# Patient Record
Sex: Female | Born: 1960 | Race: White | Hispanic: No | Marital: Married | Smoking: Former smoker
Health system: Southern US, Community
[De-identification: ages and names within clinical notes are randomized; demographics above are authoritative.]

## PROBLEM LIST (undated history)

## (undated) DIAGNOSIS — N926 Irregular menstruation, unspecified: Secondary | ICD-10-CM

## (undated) DIAGNOSIS — K219 Gastro-esophageal reflux disease without esophagitis: Secondary | ICD-10-CM

## (undated) DIAGNOSIS — J9383 Other pneumothorax: Secondary | ICD-10-CM

## (undated) DIAGNOSIS — L719 Rosacea, unspecified: Secondary | ICD-10-CM

## (undated) DIAGNOSIS — N840 Polyp of corpus uteri: Secondary | ICD-10-CM

## (undated) DIAGNOSIS — K644 Residual hemorrhoidal skin tags: Secondary | ICD-10-CM

## (undated) DIAGNOSIS — R42 Dizziness and giddiness: Secondary | ICD-10-CM

## (undated) DIAGNOSIS — F419 Anxiety disorder, unspecified: Secondary | ICD-10-CM

## (undated) DIAGNOSIS — I493 Ventricular premature depolarization: Secondary | ICD-10-CM

## (undated) HISTORY — DX: Rosacea, unspecified: L71.9

## (undated) HISTORY — DX: Gastro-esophageal reflux disease without esophagitis: K21.9

## (undated) HISTORY — PX: HYSTEROSCOPY WITH RESECTOSCOPE: SHX5395

## (undated) HISTORY — DX: Anxiety disorder, unspecified: F41.9

## (undated) HISTORY — DX: Dizziness and giddiness: R42

## (undated) HISTORY — DX: Polyp of corpus uteri: N84.0

## (undated) HISTORY — DX: Other pneumothorax: J93.83

## (undated) HISTORY — PX: OTHER SURGICAL HISTORY: SHX169

## (undated) HISTORY — DX: Residual hemorrhoidal skin tags: K64.4

## (undated) HISTORY — DX: Irregular menstruation, unspecified: N92.6

## (undated) HISTORY — DX: Ventricular premature depolarization: I49.3

---

## 1978-07-21 HISTORY — PX: APPENDECTOMY: SHX54

## 1990-07-21 HISTORY — PX: THORACOTOMY: SUR1349

## 1990-10-20 DIAGNOSIS — J9383 Other pneumothorax: Secondary | ICD-10-CM

## 1990-10-20 HISTORY — DX: Other pneumothorax: J93.83

## 1996-07-21 HISTORY — PX: EXCISION MORTON'S NEUROMA: SHX5013

## 2001-05-26 ENCOUNTER — Other Ambulatory Visit: Admission: RE | Admit: 2001-05-26 | Discharge: 2001-05-26 | Payer: Self-pay | Admitting: *Deleted

## 2003-09-22 ENCOUNTER — Encounter: Admission: RE | Admit: 2003-09-22 | Discharge: 2003-09-22 | Payer: Self-pay | Admitting: Family Medicine

## 2004-05-02 ENCOUNTER — Other Ambulatory Visit: Admission: RE | Admit: 2004-05-02 | Discharge: 2004-05-02 | Payer: Self-pay | Admitting: Family Medicine

## 2005-09-02 ENCOUNTER — Other Ambulatory Visit: Admission: RE | Admit: 2005-09-02 | Discharge: 2005-09-02 | Payer: Self-pay | Admitting: Family Medicine

## 2006-07-29 ENCOUNTER — Encounter: Admission: RE | Admit: 2006-07-29 | Discharge: 2006-07-29 | Payer: Self-pay | Admitting: Family Medicine

## 2007-04-12 ENCOUNTER — Ambulatory Visit: Payer: Self-pay | Admitting: Surgery

## 2007-07-19 ENCOUNTER — Other Ambulatory Visit: Admission: RE | Admit: 2007-07-19 | Discharge: 2007-07-19 | Payer: Self-pay | Admitting: Family Medicine

## 2007-08-11 ENCOUNTER — Encounter: Admission: RE | Admit: 2007-08-11 | Discharge: 2007-08-11 | Payer: Self-pay | Admitting: Family Medicine

## 2008-07-31 ENCOUNTER — Other Ambulatory Visit: Admission: RE | Admit: 2008-07-31 | Discharge: 2008-07-31 | Payer: Self-pay | Admitting: Family Medicine

## 2008-08-31 ENCOUNTER — Encounter: Admission: RE | Admit: 2008-08-31 | Discharge: 2008-08-31 | Payer: Self-pay | Admitting: Family Medicine

## 2008-10-12 ENCOUNTER — Encounter (INDEPENDENT_AMBULATORY_CARE_PROVIDER_SITE_OTHER): Payer: Self-pay | Admitting: Obstetrics and Gynecology

## 2008-10-12 ENCOUNTER — Ambulatory Visit (HOSPITAL_COMMUNITY): Admission: RE | Admit: 2008-10-12 | Discharge: 2008-10-12 | Payer: Self-pay | Admitting: Obstetrics and Gynecology

## 2009-08-07 ENCOUNTER — Other Ambulatory Visit: Admission: RE | Admit: 2009-08-07 | Discharge: 2009-08-07 | Payer: Self-pay | Admitting: Family Medicine

## 2009-09-04 ENCOUNTER — Encounter: Admission: RE | Admit: 2009-09-04 | Discharge: 2009-09-04 | Payer: Self-pay | Admitting: Family Medicine

## 2009-11-08 ENCOUNTER — Encounter: Admission: RE | Admit: 2009-11-08 | Discharge: 2009-11-08 | Payer: Self-pay | Admitting: Family Medicine

## 2010-08-07 ENCOUNTER — Other Ambulatory Visit
Admission: RE | Admit: 2010-08-07 | Discharge: 2010-08-07 | Payer: Self-pay | Source: Home / Self Care | Admitting: Family Medicine

## 2010-08-07 ENCOUNTER — Other Ambulatory Visit: Payer: Self-pay | Admitting: Family Medicine

## 2010-10-29 ENCOUNTER — Other Ambulatory Visit: Payer: Self-pay | Admitting: Family Medicine

## 2010-10-29 DIAGNOSIS — Z1231 Encounter for screening mammogram for malignant neoplasm of breast: Secondary | ICD-10-CM

## 2010-10-31 LAB — COMPREHENSIVE METABOLIC PANEL
ALT: 19 U/L (ref 0–35)
AST: 56 U/L — ABNORMAL HIGH (ref 0–37)
CO2: 26 mEq/L (ref 19–32)
Chloride: 100 mEq/L (ref 96–112)
Creatinine, Ser: 0.82 mg/dL (ref 0.4–1.2)
GFR calc Af Amer: >60 mL/min (ref >60)
GFR calc non Af Amer: >60 mL/min (ref >60)
Glucose, Bld: 86 mg/dL (ref 70–99)
Sodium: 132 mEq/L — ABNORMAL LOW (ref 135–145)
Total Bilirubin: 0.9 mg/dL (ref 0.3–1.2)

## 2010-10-31 LAB — CBC
Hemoglobin: 12.8 g/dL (ref 12.0–15.0)
MCV: 88.2 fL (ref 78.0–100.0)
RBC: 4.3 MIL/uL (ref 3.87–5.11)
WBC: 6.3 10*3/uL (ref 4.0–10.5)

## 2010-10-31 LAB — PREGNANCY, URINE: Preg Test, Ur: NEGATIVE

## 2010-11-04 ENCOUNTER — Ambulatory Visit
Admission: RE | Admit: 2010-11-04 | Discharge: 2010-11-04 | Disposition: A | Discharge status: Home / Self Care | Payer: BLUE CROSS/BLUE SHIELD | Source: Ambulatory Visit | Attending: Family Medicine | Admitting: Family Medicine

## 2010-11-04 DIAGNOSIS — Z1231 Encounter for screening mammogram for malignant neoplasm of breast: Secondary | ICD-10-CM

## 2010-12-03 NOTE — Procedures (Signed)
DUPLEX ULTRASOUND OF ABDOMINAL AORTA   INDICATION:  Rule out abdominal aortic aneurysm.   HISTORY:  Diabetes:  No.  Cardiac:  No.  Hypertension:  No.  Smoking:  Quit in 1992.  Connective Tissue Disorder:  Family History:  The patient's father had a cerebral aneurysm.  Several  of the patient's uncles and cousins have a history of aneurysms as well.  Previous Surgery:  The patient has staples for a spontaneous  pneumothorax.  The patient has a history of C-section delivery.   DUPLEX EXAM:         AP (cm)                   TRANSVERSE (cm)  Proximal             1.4 cm                    1.8 cm  Mid                  1.4 cm                    1.6 cm  Distal               1.2 cm                    1.3 cm  Right Iliac          0.6 cm                    0.7 cm  Left Iliac           0.8 cm                    0.7 cm   PREVIOUS:  Date:  AP:  TRANSVERSE:   IMPRESSION:  No evidence of significant aortic dilation.   ___________________________________________  Janetta Hora Fields, MD   MC/MEDQ  D:  04/12/2007  T:  04/13/2007  Job:  [id number]

## 2010-12-03 NOTE — H&P (Signed)
[name], [name] NO.:  [id number]   MEDICAL RECORD NO.:  [id number]          PATIENT TYPE:  AMB   LOCATION:  SDC                           [facility]ACILITY:  [facility]   PHYSICIAN:  Mindy [name], MDDATE OF BIRTH:  05-Apr-1961   DATE OF ADMISSION:  DATE OF DISCHARGE:                              HISTORY & PHYSICAL   She is to be admitted on October 12, 2008, originally referred from Dr.  Nicholos [name], Mindy [name] at [facility] for irregular periods.  She is not postmenopausal  to be known to me, but has had endometrial polyps noted on ultrasound  and sonohysterogram performed September 12, 2008.  She is now to be  admitted to undergo hysterotomy, D&C, gave informed consent, accepts the  risks of infection, bleeding, bowel and bladder damage, uterine  perforation, blood product risk including hepatitis and HIV exposure.   PAST MEDICAL HISTORY:  Former smoker status, esophageal reflux, anxiety,  rosacea, history of external hemorrhoids and spontaneous pneumothorax  bilaterally, April 1992 with subsequent sclerosis.   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   MEDICATIONS:  1. Phenergan LA 1 capsule twice a day as needed.  2. Maxair Autohaler 200 mcg per inhalation, 4 puffs as needed every 1      hour.  3. Protonix 40 mg once a day.   ALLERGIES:  VENTOLIN HFA makes her nervous; MINOCIN, lightheadedness;  Theo-Dur, rash; and Z-Pak, hives.   SOCIAL HISTORY:  A glass of wine per week.  No tobacco, ethanol, or drug  use.  She is married in a monogamous relationship with her husband.   FAMILY HISTORY:  Father is stable with cerebral aneurysm, abdominal  aneurysm, and hypertension.  Hypertension is present in her mother and  is with emphysema and asthma and she is on continuous O2.  Paternal  grandmother  with diabetes and breast cancer.  Maternal grandmother with  diabetes, heart attack at age 66.  Sister healthy.   PHYSICAL EXAMINATION:  VITAL SIGNS:  Blood pressure 100/60, heart rate  60, respirations 20, afebrile.  LUNGS:  Clear bilaterally.  Slightly decreased upper portion on the  left.  No rales or rhonchi.  ABDOMEN:  Soft, flat, nontender.  HEART:  Regular rate and rhythm.  PELVIC:  Normal external female genitalia.  Bartholin, urethral, and  Skene glands within normal limits.  Vault without discharge or lesions.  Multiparous cervix.  Tenaculum was used.  Dilators were used and Pipelle  was passed without difficulty on September 06, 2008, sounded to 8 cm,  moderate amount of tissue was aspirated with single pass.  The patient  tolerated the procedure well.  Tenaculum was removed.  No bleeding was  noted.  At that time, there was some spotting from the cervix with  biopsy.   ASSESSMENT:  1. Irregular periods, 626.4.  2. Endometrial polyps, 621.0.   PLAN:  Hysteroscopy, D&C with MAC plus paracervical block.  Preoperative  CBC, BMET,  and urine pregnancy test, n.p.o. after 0800 for scheduled 2  o'clock surgical case, clears up until that time.  Pain medications on a  scheduled basis with sips of water.  We will proceed as outlined.  She  gives informed consent.      Mindy A. Sydnee Cabal, MD  Electronically Signed     CAD/MEDQ  D:  10/11/2008  T:  10/12/2008  Job:  [id number]

## 2010-12-03 NOTE — Op Note (Signed)
NAMEMAHA, FISCHEL NO.:  [id number]   MEDICAL RECORD NO.:  [id number]          PATIENT TYPE:  AMB   LOCATION:  SDC                           [facility]ILITY[facility]  [facility]   PHYSICIAN:  Charles A. Delcambre, MDDATE OF BIRTH:  April 30, 1961   DATE OF PROCEDURE:  10/12/2008  DATE OF DISCHARGE:                               OPERATIVE REPORT   PREOPERATIVE DIAGNOSES:  1. Irregular bleeding  2. Endometrial polyps.   POSTOPERATIVE DIAGNOSES:  1. Irregular bleeding  2. Endometrial polyps.  3. Submucosal leiomyoma.   PROCEDURES:  1. Diagnostic hysteroscopy.  2. Resectoscope used to remove polyps and fibroids.  3. Paracervical block.   SURGEON:  Charles A. Delcambre, MD   ASSISTANT:  None.   COMPLICATIONS:  None.   ESTIMATED BLOOD LOSS:  Less than 10 mL.   COUNTS:  Instrument, sponge, and needle counts were correct x2.   SORBITOL LOSS:  100 mL   SPECIMENS:  Endometrial curettings and endometrial polyps/fibroids to  Pathology.   FINDINGS:  Multiple endometrial fundal polyps and a submucosal fibroid.   ANESTHESIA:  Monitored anesthesia care with IV sedation.   DESCRIPTION OF PROCEDURE:  The patient was taken to the operating room,  placed in supine position.  Anesthesia was given.  She was sedated.  She  was placed in dorsal lithotomy position.  Sterile prep and drape was  undertaken.  Time-out was done.  Weighted speculum was placed.  Tenaculum was placed on the anterior lip of the cervix and sound was to  8 cm dilation, 4 and 5-mm hysteroscope was then done.  Hysteroscope was  passed without difficulty or perforation.  Findings were noted above.  Hysteroscope was removed.  The further dilation was done to accommodate  the resectoscope.  Resectoscope single loop, with settings 90 watts cut,  50 watts coag was used with a single electrode, single loop used to  resect the polyps and the fibroid without difficulty.  There was no  evidence of complication.  Specimens were  retrieved, one last look  indicated in clear uterine cavity with good hemostasis.  Procedure was then terminated.  All instruments were removed.  Hemostasis was verified from the uterus as well as the anterior lip of  the cervix at the tenaculum site.  She is taken to recovery with  physician in attendance, having tolerated the procedure well.      Charles A. Sydnee Cabal, MD  Electronically Signed     CAD/MEDQ  D:  10/12/2008  T:  10/13/2008  Job:  [id number]

## 2010-12-08 ENCOUNTER — Emergency Department (HOSPITAL_COMMUNITY): Payer: BC Managed Care – PPO

## 2010-12-08 ENCOUNTER — Emergency Department (HOSPITAL_COMMUNITY)
Admission: EM | Admit: 2010-12-08 | Discharge: 2010-12-08 | Disposition: A | Discharge status: Home / Self Care | Payer: BC Managed Care – PPO | Attending: Emergency Medicine | Admitting: Emergency Medicine

## 2010-12-08 DIAGNOSIS — M25569 Pain in unspecified knee: Secondary | ICD-10-CM | POA: Insufficient documentation

## 2010-12-08 DIAGNOSIS — W1809XA Striking against other object with subsequent fall, initial encounter: Secondary | ICD-10-CM | POA: Insufficient documentation

## 2010-12-08 DIAGNOSIS — S0180XA Unspecified open wound of other part of head, initial encounter: Secondary | ICD-10-CM | POA: Insufficient documentation

## 2010-12-08 DIAGNOSIS — J45909 Unspecified asthma, uncomplicated: Secondary | ICD-10-CM | POA: Insufficient documentation

## 2010-12-08 DIAGNOSIS — IMO0002 Reserved for concepts with insufficient information to code with codable children: Secondary | ICD-10-CM | POA: Insufficient documentation

## 2010-12-08 DIAGNOSIS — Y92009 Unspecified place in unspecified non-institutional (private) residence as the place of occurrence of the external cause: Secondary | ICD-10-CM | POA: Insufficient documentation

## 2010-12-08 DIAGNOSIS — M79609 Pain in unspecified limb: Secondary | ICD-10-CM | POA: Insufficient documentation

## 2010-12-08 DIAGNOSIS — M25519 Pain in unspecified shoulder: Secondary | ICD-10-CM | POA: Insufficient documentation

## 2010-12-08 DIAGNOSIS — S60229A Contusion of unspecified hand, initial encounter: Secondary | ICD-10-CM | POA: Insufficient documentation

## 2010-12-19 ENCOUNTER — Emergency Department (HOSPITAL_COMMUNITY): Payer: BC Managed Care – PPO

## 2010-12-19 ENCOUNTER — Emergency Department (HOSPITAL_COMMUNITY)
Admission: EM | Admit: 2010-12-19 | Discharge: 2010-12-19 | Disposition: A | Discharge status: Home / Self Care | Payer: BC Managed Care – PPO | Attending: Emergency Medicine | Admitting: Emergency Medicine

## 2010-12-19 DIAGNOSIS — R42 Dizziness and giddiness: Secondary | ICD-10-CM | POA: Insufficient documentation

## 2010-12-19 DIAGNOSIS — R002 Palpitations: Secondary | ICD-10-CM | POA: Insufficient documentation

## 2010-12-19 DIAGNOSIS — M542 Cervicalgia: Secondary | ICD-10-CM | POA: Insufficient documentation

## 2010-12-19 DIAGNOSIS — J45909 Unspecified asthma, uncomplicated: Secondary | ICD-10-CM | POA: Insufficient documentation

## 2010-12-19 DIAGNOSIS — K219 Gastro-esophageal reflux disease without esophagitis: Secondary | ICD-10-CM | POA: Insufficient documentation

## 2010-12-19 DIAGNOSIS — R209 Unspecified disturbances of skin sensation: Secondary | ICD-10-CM | POA: Insufficient documentation

## 2010-12-19 DIAGNOSIS — F411 Generalized anxiety disorder: Secondary | ICD-10-CM | POA: Insufficient documentation

## 2010-12-19 LAB — DIFFERENTIAL
Basophils Absolute: 0 10*3/uL (ref 0.0–0.1)
Basophils Relative: 0 % (ref 0–1)
Eosinophils Relative: 0 % (ref 0–5)
Monocytes Absolute: 0.5 10*3/uL (ref 0.1–1.0)

## 2010-12-19 LAB — BASIC METABOLIC PANEL
Calcium: 9 mg/dL (ref 8.4–10.5)
GFR calc Af Amer: >60 mL/min (ref >60)
GFR calc non Af Amer: >60 mL/min (ref >60)
Glucose, Bld: 99 mg/dL (ref 70–99)
Sodium: 140 mEq/L (ref 135–145)

## 2010-12-19 LAB — CBC
MCHC: 34.7 g/dL (ref 30.0–36.0)
Platelets: 186 10*3/uL (ref 150–400)
RDW: 12.3 % (ref 11.5–15.5)
WBC: 7.9 10*3/uL (ref 4.0–10.5)

## 2010-12-19 MED ORDER — IOHEXOL 350 MG/ML SOLN
50.0000 mL | Freq: Once | INTRAVENOUS | Status: AC | PRN
  Administered 2010-12-19: 50 mL via INTRAVENOUS

## 2010-12-24 ENCOUNTER — Encounter: Payer: Self-pay | Admitting: Internal Medicine

## 2010-12-24 ENCOUNTER — Ambulatory Visit (INDEPENDENT_AMBULATORY_CARE_PROVIDER_SITE_OTHER): Payer: BC Managed Care – PPO | Admitting: Internal Medicine

## 2010-12-24 DIAGNOSIS — I498 Other specified cardiac arrhythmias: Secondary | ICD-10-CM

## 2010-12-24 DIAGNOSIS — I4949 Other premature depolarization: Secondary | ICD-10-CM

## 2010-12-24 DIAGNOSIS — I493 Ventricular premature depolarization: Secondary | ICD-10-CM

## 2010-12-24 DIAGNOSIS — R55 Syncope and collapse: Secondary | ICD-10-CM

## 2010-12-24 DIAGNOSIS — R001 Bradycardia, unspecified: Secondary | ICD-10-CM

## 2010-12-24 NOTE — Assessment & Plan Note (Signed)
As above.

## 2010-12-24 NOTE — Assessment & Plan Note (Signed)
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 The problem with treating this is going to be her resting bradycardia. It may well be that she will need flecainide as a PVC suppressing agent as it will not likely slow her heart rate

## 2010-12-24 NOTE — Progress Notes (Signed)
HPI: Mindy Green is a 50 y.o. female Is seen at her own request.  She saw Dr Nicholos Johns last week and he had referred her to [place].  We are only lately aware of this The patient fell two weeks ago while running in heels with the dog.  She says hse does this frequently  She was aware that she was falling and saw the ground conming up to meet her  She went to ER\S  Subsequently she has had a few spells characterized I. Palpitations with a sense of fluttering in her throat accompanied by flushing some nausea tinnitus and presyncope. It occasionally can also by headache. They did resolve and she is left with some fatigue. She has no antecedent history of syncope.  She was given an event recorder. This apparently demonstrates PVCs.  Her initial cardiogram demonstrated sinus bradycardia and was recommended she increase her already copious caffeine intake.  She has had problems with headaches. Head CT was negative in the emergency room.     Current Outpatient Prescriptions  Medication Sig Dispense Refill  . ALPRAZolam (XANAX) 0.5 MG tablet Take 0.5 mg by mouth at bedtime as needed.        . doxycycline (VIBRAMYCIN) 50 MG/5ML SYRP Take by mouth as needed.        . fexofenadine (ALLEGRA) 180 MG tablet Take 180 mg by mouth daily.        . fluticasone (FLONASE) 50 MCG/ACT nasal spray Place 2 sprays into the nose as needed.        Marland Kitchen ibuprofen (ADVIL,MOTRIN) 200 MG tablet Take 200 mg by mouth every 6 (six) hours as needed.        . Multiple Vitamin (MULTIVITAMIN) tablet Take 1 tablet by mouth daily.        . pirbuterol (MAXAIR AUTOHALER) 200 MCG/INH inhaler Inhale 2 puffs into the lungs 4 (four) times daily as needed.          Allergies  Allergen Reactions  . Penicillins     Past Medical History  Diagnosis Date  . Asthma   . Dizziness   . Fluttering heart   . Lightheadedness     Past Surgical History  Procedure Date  . Appendectomy 1980  . Cesarean section 1984  . Cesarean section 1986      Family History  Problem Relation Age of Onset  . Hypertension Father 40    alive  . Asthma Mother 64    alive  . Other Sister 66    alive no medical problems    History   Social History  . Marital Status: Married    Spouse Name: N/A    Number of Children: 2  . Years of Education: N/A   Occupational History  . [employer] PA[employer]   Social History Main Topics  . Smoking status: Former Smoker    Types: Cigarettes    Quit date: 07/21/1990  . Smokeless tobacco: Not on file  . Alcohol Use: Yes     4-5 beers a week  . Drug Use: No  . Sexually Active: Not on file   Other Topics Concern  . Not on file   Social History Narrative  . No narrative on file    Fourteen point review of systems was negative except as noted in HPI and PMH   PHYSICAL EXAMINATION  Blood pressure 108/75, pulse 75, resp. rate 18, height 5\' 4"  (1.626 m), weight 124 lb (56.246 kg).   Well developed  and nourished middle aged woman in no acute distress HENT normal Neck supple with JVP-flat Carotids brisk and full without bruits Back without scoliosis or kyphosis Clear Regular rate and rhythm, no murmurs or gallops Abd-soft with active BS without hepatomegaly or midline pulsation Femoral pulses 2+ distal pulses intact No Clubbing cyanosis edema Skin-warm and dry LN-neg submandibular and supraclavicular A & Oriented CN 3-12 normal  Grossly normal sensory and motor function Affect engaging . Sinus @ 53  Ow normal .14/.08/.42

## 2010-12-24 NOTE — Assessment & Plan Note (Signed)
The initial fall sounds like a trip. The fact that she was aware of the ground as she was falling suggested there was no impairment in consciousness. This is true notwithstanding the fact that she does not think this occurred. I don't have a better explanation.    the fact that she has PVCs and has had other symptoms related to these suggest the possibility of an arrhythmia.

## 2010-12-24 NOTE — Patient Instructions (Signed)
Your physician has requested that you have an echocardiogram. Echocardiography is a painless test that uses sound waves to create images of your heart. It provides your doctor with information about the size and shape of your heart and how well your heart's chambers and valves are working. This procedure takes approximately one hour. There are no restrictions for this procedure.  Your physician recommends that you schedule a follow-up appointment in: 3 weeks.

## 2010-12-25 ENCOUNTER — Encounter: Payer: Self-pay | Admitting: *Deleted

## 2010-12-31 ENCOUNTER — Ambulatory Visit (HOSPITAL_COMMUNITY): Payer: BC Managed Care – PPO | Attending: Internal Medicine

## 2010-12-31 DIAGNOSIS — I493 Ventricular premature depolarization: Secondary | ICD-10-CM

## 2010-12-31 DIAGNOSIS — I079 Rheumatic tricuspid valve disease, unspecified: Secondary | ICD-10-CM | POA: Insufficient documentation

## 2010-12-31 DIAGNOSIS — R55 Syncope and collapse: Secondary | ICD-10-CM | POA: Insufficient documentation

## 2010-12-31 DIAGNOSIS — R002 Palpitations: Secondary | ICD-10-CM | POA: Insufficient documentation

## 2010-12-31 DIAGNOSIS — R001 Bradycardia, unspecified: Secondary | ICD-10-CM

## 2011-01-01 ENCOUNTER — Telehealth: Payer: Self-pay | Admitting: Internal Medicine

## 2011-01-01 DIAGNOSIS — R002 Palpitations: Secondary | ICD-10-CM

## 2011-01-01 NOTE — Telephone Encounter (Signed)
Pt wants echo results and has questions

## 2011-01-01 NOTE — Telephone Encounter (Signed)
I spoke with the patient. I made her aware that her echo looks ok, but that Dr. Graciela Husbands still has to review this. She states that she is having worsening palpitations and does not feel well. Dr. Renato Gails has her on increased caffeine intake due to her bradycardia. She did try to cut out her caffeine and on Sunday and Monday, but states this did not help. Due to bradycardia, Dr. Graciela Husbands has not started the patient on beta blockers. He had mentioned in his last office note that flecainide may be an option. I explained I will review this with Dr. Graciela Husbands to see what he recommends and try to call her back as early on Friday afternoon as I can as the patient is trying to go to the beach this weekend. She is agreeable.

## 2011-01-02 NOTE — Telephone Encounter (Signed)
Lets begin flecanide at 50mg  bid steve

## 2011-01-03 ENCOUNTER — Telehealth: Payer: Self-pay | Admitting: Internal Medicine

## 2011-01-03 MED ORDER — FLECAINIDE ACETATE 50 MG PO TABS: 50.0000 mg | ORAL_TABLET | Freq: Two times a day (BID) | ORAL | Status: DC

## 2011-01-03 NOTE — Telephone Encounter (Signed)
I left a message for the patient that Dr Graciela Husbands felt she should be ok to go to [place].

## 2011-01-03 NOTE — Telephone Encounter (Signed)
Changed pharmacy in [facility] seems as if there was an error

## 2011-01-03 NOTE — Telephone Encounter (Signed)
Per pt sister calling regarding flecainide 50 mg [facility] on [address].

## 2011-01-03 NOTE — Telephone Encounter (Signed)
Addended bySherri Rad C on: 01/03/2011 10:07 AM   Modules accepted: Orders

## 2011-01-03 NOTE — Telephone Encounter (Signed)
Flecainide 50 mg. [facility] on [address]

## 2011-01-03 NOTE — Telephone Encounter (Signed)
Looks as if Jefferey Pica, RN has sent this medication in already called pt and told her med's is at the pharmacy and if she had anymore promblems to give [place] a call back

## 2011-01-03 NOTE — Telephone Encounter (Signed)
The patient is aware that Dr. Graciela Husbands will go ahead and start her on flecainide 50mg  bid. I will call this in to her pharmacy. She wanted to know if she could go out of town this weekend. I explained she should be ok to do this, but to be very aware of how she is feeling and to stop flecainide if she feels worse. She voices understanding.  She also wants to know if she should be ok to go to [place] the first week of July due to her symptoms. I explained I will review with Dr. Graciela Husbands and call her back today or early next week.

## 2011-01-16 ENCOUNTER — Ambulatory Visit (INDEPENDENT_AMBULATORY_CARE_PROVIDER_SITE_OTHER): Payer: BC Managed Care – PPO | Admitting: Internal Medicine

## 2011-01-16 ENCOUNTER — Encounter: Payer: Self-pay | Admitting: Internal Medicine

## 2011-01-16 VITALS — BP 118/76 | HR 62 | Ht 64.0 in | Wt 121.0 lb

## 2011-01-16 DIAGNOSIS — R55 Syncope and collapse: Secondary | ICD-10-CM

## 2011-01-16 DIAGNOSIS — R42 Dizziness and giddiness: Secondary | ICD-10-CM

## 2011-01-16 NOTE — Assessment & Plan Note (Signed)
No recurrent events. I think it's okay for her to resume driving

## 2011-01-16 NOTE — Assessment & Plan Note (Signed)
We discussed. Strategies to try to negate this. The first is to increase her salt intake. She is quite deplete in salt intake But she is somewhat averse to this as it is associated with edema. The second is to be careful with the heat and maintain good hydration. We also discussed the possible role of ProAmatine. We'll hold that in abeyance for now

## 2011-01-16 NOTE — Assessment & Plan Note (Addendum)
As noted; we will continue her on flecainide at the current dose.  We'll check her magnesium level and put her on over-the-counter magnesium. Will also check her TSH ; metabolic profile and hemoglobin were normal in May

## 2011-01-16 NOTE — Progress Notes (Signed)
HPI: Mindy Green is a 50 y.o. female Seen in followup for palpitations with a monitor that demonstrated PVCs and PACs as well as an episode of Possible syncope. On that occasion she fell while running in high heels with her dog. She says that this Type of running is normal for her. She was aware of see the ground coming at her.  She's also had problems with orthostatic dizziness. Because of her ectopy, we tried her on low-dose flecainide. She think she is 50-60% better. She occasionally has some palpitations. The dizziness is not significantly improved. I should note that she is prone to edema. Current Outpatient Prescriptions  Medication Sig Dispense Refill  . ALPRAZolam (XANAX) 0.5 MG tablet Take 0.5 mg by mouth at bedtime as needed.        . doxycycline (VIBRAMYCIN) 50 MG/5ML SYRP Take by mouth as needed.        . fexofenadine (ALLEGRA) 180 MG tablet Take 180 mg by mouth daily.        . flecainide (TAMBOCOR) 50 MG tablet Take 1 tablet (50 mg total) by mouth 2 (two) times daily.  60 tablet  6  . fluticasone (FLONASE) 50 MCG/ACT nasal spray Place 2 sprays into the nose as needed.        Marland Kitchen ibuprofen (ADVIL,MOTRIN) 200 MG tablet Take 200 mg by mouth every 6 (six) hours as needed.        . Multiple Vitamin (MULTIVITAMIN) tablet Take 1 tablet by mouth daily.        . pirbuterol (MAXAIR AUTOHALER) 200 MCG/INH inhaler Inhale 2 puffs into the lungs 4 (four) times daily as needed.          Allergies  Allergen Reactions  . Penicillins     Past Medical History  Diagnosis Date  . Asthma   . Dizziness   . Fluttering heart   . Lightheadedness     Past Surgical History  Procedure Date  . Appendectomy 1980  . Cesarean section 1984  . Cesarean section 1986    Family History  Problem Relation Age of Onset  . Hypertension Father 40    alive  . Asthma Mother 25    alive  . Other Sister 23    alive no medical problems    History   Social History  . Marital Status: Married   Spouse Name: N/A    Number of Children: 2  . Years of Education: N/A   Occupational History  . 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 Smoking status: Former Smoker    Types: Cigarettes    Quit date: 07/21/1990  . Smokeless tobacco: Not on file  . Alcohol Use: Yes     4-5 beers a week  . Drug Use: No  . Sexually Active: Not on file   Other Topics Concern  . Not on file   Social History Narrative  . No narrative on file    Fourteen point review of systems was negative except as noted in HPI and PMH   PHYSICAL EXAMINATION  Blood pressure 118/76, pulse 62, height 5\' 4"  (1.626 m), weight 121 lb (54.885 kg).   Well developed and nourished in no acute distress HENT normal Neck supple Back without scoliosis or kyphosis Clear Regular rate and rhythm, no murmurs or gallops Abd-soft with active BS  Femoral pulses 2+ distal pulses intact No Clubbing cyanosis edema Skin-warm and dry LN-neg submandibular and supraclavicular A & Oriented CN  3-12 normal  Grossly normal sensory and motor function Affect engaging . Event recorder was reviewed from [place]. It demonstrated PVCs and PACs and atrial couplets correlated with her symptoms

## 2011-01-16 NOTE — Patient Instructions (Signed)
Your physician recommends that you return for lab work today: tsh/cbc/magnesium (785.1)  Your physician wants you to follow-up in: 6 months. You will receive a reminder letter in the mail two months in advance. If you don't receive a letter, please call our office to schedule the follow-up appointment.

## 2011-01-17 LAB — MAGNESIUM: Magnesium: 2.4 mg/dL (ref 1.5–2.5)

## 2011-01-17 LAB — TSH: TSH: 0.91 u[IU]/mL (ref 0.35–5.50)

## 2011-05-19 ENCOUNTER — Telehealth: Payer: Self-pay | Admitting: Internal Medicine

## 2011-05-19 NOTE — Telephone Encounter (Signed)
I talked with pt. Pt states in the last couple of weeks she has had  general lethargy that seems to be progressively worse. Pt states her resting  heart rate has been in the 48-62 range. She does not know her BP. Pt denies any other symptoms including increase in dizziness or palpitations. Pt also denies any signs of bleeding. Pt states there have been no changes , including any medication changes, in this period  of time that may contribute to her lethargy. She  has not been in touch with her PCP Dr Nicholos Johns about this. Last CBC 12/19/10. Last magnesium and TSH 01/16/11. Pt feels her medications may need to be adjusted. I will forward to Dr Graciela Husbands for review.

## 2011-05-19 NOTE — Telephone Encounter (Signed)
Maybe we can set  her up to see Lorin Picket or me whoever as  earlier opening

## 2011-05-19 NOTE — Telephone Encounter (Signed)
New message:  Pt called and has a couple of questions about being tired all the time.

## 2011-05-20 NOTE — Telephone Encounter (Signed)
I spoke with the patient. She will see [name] on 11/2.

## 2011-05-23 ENCOUNTER — Ambulatory Visit (INDEPENDENT_AMBULATORY_CARE_PROVIDER_SITE_OTHER): Payer: BC Managed Care – PPO | Admitting: Physician Assistant

## 2011-05-23 DIAGNOSIS — J45909 Unspecified asthma, uncomplicated: Secondary | ICD-10-CM | POA: Insufficient documentation

## 2011-05-23 DIAGNOSIS — R5381 Other malaise: Secondary | ICD-10-CM

## 2011-05-23 DIAGNOSIS — R42 Dizziness and giddiness: Secondary | ICD-10-CM

## 2011-05-23 DIAGNOSIS — R5383 Other fatigue: Secondary | ICD-10-CM | POA: Insufficient documentation

## 2011-05-23 DIAGNOSIS — I4949 Other premature depolarization: Secondary | ICD-10-CM

## 2011-05-23 DIAGNOSIS — I493 Ventricular premature depolarization: Secondary | ICD-10-CM

## 2011-05-23 LAB — CBC WITH DIFFERENTIAL/PLATELET
Basophils Absolute: 0 10*3/uL (ref 0.0–0.1)
Hemoglobin: 13.8 g/dL (ref 12.0–15.0)
Lymphocytes Relative: 34.3 % (ref 12.0–46.0)
Monocytes Relative: 7.5 % (ref 3.0–12.0)
Neutro Abs: 3.5 10*3/uL (ref 1.4–7.7)
RDW: 12 % (ref 11.5–14.6)
WBC: 6.2 10*3/uL (ref 4.5–10.5)

## 2011-05-23 LAB — TSH: TSH: 0.97 u[IU]/mL (ref 0.35–5.50)

## 2011-05-23 LAB — BASIC METABOLIC PANEL
Calcium: 9.3 mg/dL (ref 8.4–10.5)
GFR: 66.3 mL/min (ref >60.00)
Glucose, Bld: 81 mg/dL (ref 70–99)
Sodium: 141 mEq/L (ref 135–145)

## 2011-05-23 NOTE — Assessment & Plan Note (Signed)
?   If fatigue is an atypical symptom of worsening asthma.  We discussed following up with PCP if above testing is unrevealing.

## 2011-05-23 NOTE — Assessment & Plan Note (Signed)
Arrange ETT-myoview to investigate fatigue as well as to r/o pro-arrhythmia with flecainide.

## 2011-05-23 NOTE — Patient Instructions (Signed)
Your physician has requested that you have en exercise stress myoview dx 780.79 fatigue, . For further information please visit [email or web address] Please follow instruction sheet, as given.  Your physician recommends that you return for lab work in: today bmet, cbc w/diff, tsh 780.79 faitgue

## 2011-05-23 NOTE — Progress Notes (Signed)
History of Present Illness: Primary Electrophysiologist:  Dr. Sherryl Manges   Mindy Green is a 50 y.o. female who presents for fatigue and low HR.  She has been seeing Dr. Graciela Husbands since 6/12 for symptomatic PVCs and orthostatic intolerance.  She has been placed on Flecainide for her PVCs.  Echo 6/12: EF 60%, normal diast function.  There was a question of syncope.  But, it was felt that this was more likely a fall than true syncope.  Conservative strategies were discussed at her last visit for orthostasis.  ProAmatine was considered but it was decided to hold off for now.    Over the last 2-3 weeks, she has felt lethargic.  She is usually energetic.  But, lately does not feel like doing anything after returning from work.  She denies exertional chest pain.  She does have frequent chest tightness with her asthma that is improved with rescue inhalers.  She notes that friends have noticed she is short of breath with activity.  She denies syncope.  She continues to feel lightheaded from time to time.  No orthopnea, PND or significant edema.  She has tried with increased fluids and salt to help with her orthostasis. She feels like it has caused a lot of fluid retention.  Her palpitations with flecainide have decreased.  She still has them somewhat.    Past Medical History  Diagnosis Date  . Asthma   . Dizziness   . Fluttering heart   . Lightheadedness   . Irregular bleeding   . Endometrial polyp   . Esophageal reflux   . Anxiety   . Rosacea   . External hemorrhoids     history of external hemorrhoids  . Spontaneous pneumothorax  April 1992     bilaterally with subsequent sclerosis    Current Outpatient Prescriptions  Medication Sig Dispense Refill  . ALPRAZolam (XANAX) 0.5 MG tablet Take 0.5 mg by mouth at bedtime as needed.        . doxycycline (VIBRAMYCIN) 50 MG/5ML SYRP Take by mouth as needed.        . fexofenadine (ALLEGRA) 180 MG tablet Take 180 mg by mouth daily.        . flecainide  (TAMBOCOR) 50 MG tablet Take 1 tablet (50 mg total) by mouth 2 (two) times daily.  60 tablet  6  . fluticasone (FLONASE) 50 MCG/ACT nasal spray Place 2 sprays into the nose as needed.        Marland Kitchen ibuprofen (ADVIL,MOTRIN) 200 MG tablet Take 200 mg by mouth every 6 (six) hours as needed.        Marland Kitchen MAGNESIUM PO Take by mouth.        . Multiple Vitamin (MULTIVITAMIN) tablet Take 1 tablet by mouth daily.        . pirbuterol (MAXAIR AUTOHALER) 200 MCG/INH inhaler Inhale 2 puffs into the lungs 4 (four) times daily as needed.          Allergies: Allergies  Allergen Reactions  . Penicillins     History  Substance Use Topics  . Smoking status: Former Smoker    Types: Cigarettes    Quit date: 07/21/1990  . Smokeless tobacco: Not on file  . Alcohol Use: Yes     4-5 beers a week     ROS:  Please see the history of present illness.  She notes worsening heartburn recently and put herself back on Protonix.  No melena, hematochezia, hematemesis, hemoptysis.  No dysphagia, odynophagia or unexplained weight  loss.  All other systems reviewed and negative.   Vital Signs: BP 98/70  Pulse 60  Ht 5\' 4"  (1.626 m)  Wt 126 lb 12.8 oz (57.516 kg)  BMI 21.77 kg/m2   Filed Vitals:   05/23/11 1054  BP: 98/70  Pulse: 60  Height: 5\' 4"  (1.626 m)  Weight: 126 lb 12.8 oz (57.516 kg)    PHYSICAL EXAM: Well nourished, well developed, in no acute distress HEENT: normal; palpebral conjunctivae are pink Neck: no JVD Vascular: no carotid bruits Cardiac:  normal S1, S2; RRR; no murmur Lungs:  clear to auscultation bilaterally, no wheezing, rhonchi or rales Abd: soft, nontender, no hepatomegaly Ext: no edema Skin: warm and dry Neuro:  CNs 2-12 intact, no focal abnormalities noted Psych: normal affect  EKG:  NSR, HR 60, normal axis, NSSTTW changes  ASSESSMENT AND PLAN:

## 2011-05-23 NOTE — Assessment & Plan Note (Signed)
Etiology not entirely clear.  She does have some chest pain and dyspnea, but this may be related to her asthma.  These symptoms are unusual for her asthma though.  I discussed with Dr. Graciela Husbands.  We decided to arrange an ETT-myoview.  If this is normal, we will have her take a trial off of Flecainide to see if this helps.  Her BP is low and did reminded her to push salt and fluids.  We will check orthostatics again today.  Also, check BMET, CBC, TSH.  If workup is unrevealing, she will need follow up with her PCP to investigate other possibilities.

## 2011-05-23 NOTE — Assessment & Plan Note (Signed)
Check orthostatic VS.  Push fluids and salt to maintain BP.

## 2011-06-02 ENCOUNTER — Encounter (HOSPITAL_COMMUNITY): Payer: BC Managed Care – PPO | Admitting: Radiology

## 2011-06-03 ENCOUNTER — Telehealth: Payer: Self-pay | Admitting: Internal Medicine

## 2011-06-03 NOTE — Telephone Encounter (Signed)
New Problem:  Patient  calling saw the PA recently. Had a tragedy in her family on Sunday night. All day on yesterday C/O  Tightness in chest, fluttering. Pt feels it getting worse.

## 2011-06-03 NOTE — Telephone Encounter (Signed)
I called the patient. She states that on Sunday night her 52 year old nephew committed suicide. She has notice chest tightness and fluttering. She states she tries to distract herself, but then she will notice these symptoms all over. She states that her and her husband had to go and talk to the detectives since her nephews parents were at the beach. I explained that her symptoms are most likely due to stress. She has xanax at home, but has not used this. I advised that she try this to see if it will help with her symptoms. She states she also had to r/s her stress test because she had caffeine prior. I advised that she continue with Xanax, and call us if her symptoms get worse. I asked that she call back as soon as possible to r/s her stress test. She verbalizes understanding.

## 2011-07-21 ENCOUNTER — Ambulatory Visit (HOSPITAL_COMMUNITY): Payer: BC Managed Care – PPO | Attending: Cardiology | Admitting: Radiology

## 2011-07-21 DIAGNOSIS — R5381 Other malaise: Secondary | ICD-10-CM | POA: Insufficient documentation

## 2011-07-21 DIAGNOSIS — R61 Generalized hyperhidrosis: Secondary | ICD-10-CM | POA: Insufficient documentation

## 2011-07-21 DIAGNOSIS — R079 Chest pain, unspecified: Secondary | ICD-10-CM

## 2011-07-21 DIAGNOSIS — I493 Ventricular premature depolarization: Secondary | ICD-10-CM

## 2011-07-21 DIAGNOSIS — J449 Chronic obstructive pulmonary disease, unspecified: Secondary | ICD-10-CM | POA: Insufficient documentation

## 2011-07-21 DIAGNOSIS — R55 Syncope and collapse: Secondary | ICD-10-CM

## 2011-07-21 DIAGNOSIS — R002 Palpitations: Secondary | ICD-10-CM | POA: Insufficient documentation

## 2011-07-21 DIAGNOSIS — Z87891 Personal history of nicotine dependence: Secondary | ICD-10-CM | POA: Insufficient documentation

## 2011-07-21 DIAGNOSIS — R0789 Other chest pain: Secondary | ICD-10-CM | POA: Insufficient documentation

## 2011-07-21 DIAGNOSIS — J4489 Other specified chronic obstructive pulmonary disease: Secondary | ICD-10-CM | POA: Insufficient documentation

## 2011-07-21 DIAGNOSIS — R9431 Abnormal electrocardiogram [ECG] [EKG]: Secondary | ICD-10-CM | POA: Insufficient documentation

## 2011-07-21 DIAGNOSIS — R42 Dizziness and giddiness: Secondary | ICD-10-CM | POA: Insufficient documentation

## 2011-07-21 DIAGNOSIS — R5383 Other fatigue: Secondary | ICD-10-CM | POA: Insufficient documentation

## 2011-07-21 MED ORDER — TECHNETIUM TC 99M TETROFOSMIN IV KIT
33.0000 | PACK | Freq: Once | INTRAVENOUS | Status: AC | PRN
  Administered 2011-07-21: 33 via INTRAVENOUS

## 2011-07-21 MED ORDER — TECHNETIUM TC 99M TETROFOSMIN IV KIT
11.0000 | PACK | Freq: Once | INTRAVENOUS | Status: AC | PRN
  Administered 2011-07-21: 11 via INTRAVENOUS

## 2011-07-21 NOTE — Progress Notes (Signed)
[facility] SITE 3 NUCLEAR MED [address] [place] [address] [phone]  Cardiology Nuclear Med Study  Mindy Green is a 50 y.o. female [id number] 05-26-1961   Nuclear Med Background Indication for Stress Test:  Evaluation for Ischemia and Abnormal EKG History:  Asthma, COPD, Emphysema and ~15 yrs ago GXT:OK per patient; 6/12 Echo:EF=60% Cardiac Risk Factors: History of Smoking  Symptoms:  Chest Tightness with Stress (last episode of chest discomfort was about 3-weeks ago), Diaphoresis, Dizziness, Fatigue, Near Syncope and Palpitations   Nuclear Pre-Procedure Caffeine/Decaff Intake:  None NPO After: 7:00pm   Lungs:  clear IV 0.9% NS with Angio Cath:  22g  IV Site: R Forearm  IV Started by:  Stanton Kidney, EMT-P  Chest Size (in):  34 Cup Size: B  Height: 5\' 4"  (1.626 m)  Weight:  123 lb (55.792 kg)  BMI:  Body mass index is 21.11 kg/(m^2). Tech Comments:  NA    Nuclear Med Study 1 or 2 day study: 1 day  Stress Test Type:  Stress  Reading MD: Olga Millers, MD  Order Authorizing Provider:  Berton Mount, MD  Resting Radionuclide: Technetium 70m Tetrofosmin  Resting Radionuclide Dose: 11.0 mCi   Stress Radionuclide:  Technetium 29m Tetrofosmin  Stress Radionuclide Dose: 33.0 mCi           Stress Protocol Rest HR: 66 Stress HR: 153  Rest BP: Sitting:117/70; Standing:111/73 Stress BP: 166/76  Exercise Time (min): 12:00 METS: 13.4   Predicted Max HR: 168 bpm % Max HR: 91.07 bpm Rate Pressure Product: 03474   Dose of Adenosine (mg):  n/a Dose of Lexiscan: n/a mg  Dose of Atropine (mg): n/a Dose of Dobutamine: n/a mcg/kg/min (at max HR)  Stress Test Technologist: Smiley Houseman, CMA-N  Nuclear Technologist:  Domenic Polite, CNMT     Rest Procedure:  Myocardial perfusion imaging was performed at rest 45 minutes following the intravenous administration of Technetium 79m Tetrofosmin.  Rest ECG: No acute changes.  Stress Procedure:  The patient  exercised for twelve minutes on the treadmill utilizing the Bruce protocol.  The patient stopped due to fatigue and denied any chest pain.  There were no diagnostic ST-T wave changes; there were nonspecific T-wave changes in recovery.  Technetium 46m Tetrofosmin was injected at peak exercise and myocardial perfusion imaging was performed after a brief delay.  Stress ECG: No significant ST segment change suggestive of ischemia.  QPS Raw Data Images:  There is diaphragmatic attenuation. Gut uptake adjacent to inferior wall at mid to apical level. Stress Images:  Mild intensity, mid to apical inferoseptal defect noted. Rest Images:  Mild intensity, mid inferoseptal defect noted. Subtraction (SDS):  3 - reversibility noted at inferoseptal apex, although in setting of adjacent gut uptake at rest. Transient Ischemic Dilatation (Normal <1.22):  0.99 Lung/Heart Ratio (Normal <0.45):  0.30  Quantitative Gated Spect Images QGS EDV:  65 ml QGS ESV:  21 ml QGS cine images:  Normal Wall Motion QGS EF: 67%  Impression Exercise Capacity:  Good exercise capacity. BP Response:  Hypertensive blood pressure response. Clinical Symptoms:  No chest pain. ECG Impression:  No significant ST segment change suggestive of ischemia. Comparison with Prior Nuclear Study: No previous nuclear study performed  Overall Impression:  Low risk stress nuclear study. The inferoseptal reversible defect at the apex seems likely artifactual in light of adjacent gut uptake, rather than clearly indicative of ischemia.  Jonelle Sidle, M.D., F.A.C.C.

## 2011-07-29 ENCOUNTER — Ambulatory Visit (INDEPENDENT_AMBULATORY_CARE_PROVIDER_SITE_OTHER): Payer: BC Managed Care – PPO | Admitting: Internal Medicine

## 2011-07-29 ENCOUNTER — Encounter: Payer: Self-pay | Admitting: Internal Medicine

## 2011-07-29 DIAGNOSIS — R42 Dizziness and giddiness: Secondary | ICD-10-CM

## 2011-07-29 DIAGNOSIS — R55 Syncope and collapse: Secondary | ICD-10-CM

## 2011-07-29 DIAGNOSIS — I4949 Other premature depolarization: Secondary | ICD-10-CM

## 2011-07-29 DIAGNOSIS — I493 Ventricular premature depolarization: Secondary | ICD-10-CM

## 2011-07-29 NOTE — Assessment & Plan Note (Signed)
No recurrent falls. 

## 2011-07-29 NOTE — Assessment & Plan Note (Signed)
   well-controlled poorly symptomatically on the flecainide

## 2011-07-29 NOTE — Progress Notes (Signed)
  HPI  Mindy Green is a 51 y.o. female Seen in followup for palpitations with a monitor that demonstrated PVCs and PACs as well as an episode of Possible syncope. On that occasion she fell while running in high heels with her dog. She says that this Type of running is normal for her. She was aware of see the ground coming at her.   She has a relatively well. There has been less dizziness and less palpitations. She relates that her nephew committed suicide in mid-November just before his 21st birthday. He lives next-door to each other.     Past Medical History  Diagnosis Date  . Asthma   . Dizziness   . Fluttering heart   . Lightheadedness   . Irregular bleeding   . Endometrial polyp   . Esophageal reflux   . Anxiety   . Rosacea   . External hemorrhoids     history of external hemorrhoids  . Spontaneous pneumothorax  April 1992     bilaterally with subsequent sclerosis    Past Surgical History  Procedure Date  . Appendectomy 1980  . Cesarean section 1984  . Cesarean section 1986  . Other surgical history      Paracervical block  . Hysteroscopy with resectoscope      used to remove polyps and fibroids  . Excision morton's neuroma  1998    of the left foot    Current Outpatient Prescriptions  Medication Sig Dispense Refill  . ALPRAZolam (XANAX) 0.5 MG tablet Take 0.5 mg by mouth at bedtime as needed.        . doxycycline (VIBRAMYCIN) 50 MG/5ML SYRP Take by mouth as needed.        . fexofenadine (ALLEGRA) 180 MG tablet Take 180 mg by mouth daily.        . flecainide (TAMBOCOR) 50 MG tablet Take 1 tablet (50 mg total) by mouth 2 (two) times daily.  60 tablet  6  . fluticasone (FLONASE) 50 MCG/ACT nasal spray Place 2 sprays into the nose as needed.        Marland Kitchen ibuprofen (ADVIL,MOTRIN) 200 MG tablet Take 200 mg by mouth every 6 (six) hours as needed.        Marland Kitchen MAGNESIUM PO Take by mouth.        . Multiple Vitamin (MULTIVITAMIN) tablet Take 1 tablet by mouth daily.        .  pirbuterol (MAXAIR AUTOHALER) 200 MCG/INH inhaler Inhale 2 puffs into the lungs 4 (four) times daily as needed.          No Known Allergies  Review of Systems negative except from HPI and PMH BP 96/62  Pulse 63  Ht 5\' 4"  (1.626 m)  Wt 122 lb (55.339 kg)  BMI 20.94 kg/m2  Physical Exam Well developed and well nourished in no acute distress HENT normal E scleral and icterus clear Neck Supple JVP flat; carotids brisk and full Clear to ausculation Regular rate and rhythm, no murmurs gallops or rub Soft with active bowel sounds No clubbing cyanosis none Edema Alert and oriented, grossly normal motor and sensory function Skin Warm and Dry   Assessment and  Plan

## 2011-07-29 NOTE — Assessment & Plan Note (Signed)
Dizziness but better

## 2011-07-29 NOTE — Patient Instructions (Signed)
Your physician wants you to follow-up in: 1 year  You will receive a reminder letter in the mail two months in advance. If you don't receive a letter, please call our office to schedule the follow-up appointment.  Your physician recommends that you continue on your current medications as directed. Please refer to the Current Medication list given to you today.  

## 2011-08-12 ENCOUNTER — Telehealth: Payer: Self-pay | Admitting: Internal Medicine

## 2011-08-12 NOTE — Telephone Encounter (Signed)
New msg Pt wants to know results of stress test please call

## 2011-08-12 NOTE — Telephone Encounter (Signed)
The patient is aware of her results.  

## 2011-08-18 ENCOUNTER — Other Ambulatory Visit: Payer: Self-pay | Admitting: Internal Medicine

## 2012-01-11 ENCOUNTER — Other Ambulatory Visit: Payer: Self-pay | Admitting: Internal Medicine

## 2012-04-28 ENCOUNTER — Telehealth: Payer: Self-pay | Admitting: Internal Medicine

## 2012-04-28 NOTE — Telephone Encounter (Signed)
plz return call to patient to discuss medical information [phone].

## 2012-04-29 NOTE — Telephone Encounter (Signed)
Pt wants a note from Dr. Graciela Husbands stating that her previous experience with syncope was an isolated incident that is no longer an issue.

## 2012-05-06 NOTE — Telephone Encounter (Signed)
Follow-up:    Patient called in wanting to know the status of her letter from Dr.Klein for her insurance company.  Please call back.

## 2012-05-18 NOTE — Telephone Encounter (Signed)
F/u  Status of letter for insurance company

## 2012-05-19 NOTE — Telephone Encounter (Signed)
Pt provided with office notes.

## 2012-07-06 ENCOUNTER — Other Ambulatory Visit: Payer: Self-pay | Admitting: Family Medicine

## 2012-07-06 DIAGNOSIS — Z1231 Encounter for screening mammogram for malignant neoplasm of breast: Secondary | ICD-10-CM

## 2012-08-09 ENCOUNTER — Ambulatory Visit
Admission: RE | Admit: 2012-08-09 | Discharge: 2012-08-09 | Disposition: A | Discharge status: Home / Self Care | Payer: 59 | Source: Ambulatory Visit | Attending: Family Medicine | Admitting: Family Medicine

## 2012-08-09 ENCOUNTER — Ambulatory Visit: Payer: BC Managed Care – PPO

## 2012-08-09 DIAGNOSIS — Z1231 Encounter for screening mammogram for malignant neoplasm of breast: Secondary | ICD-10-CM

## 2012-08-26 ENCOUNTER — Ambulatory Visit (INDEPENDENT_AMBULATORY_CARE_PROVIDER_SITE_OTHER): Payer: 59 | Admitting: Internal Medicine

## 2012-08-26 ENCOUNTER — Encounter: Payer: Self-pay | Admitting: Internal Medicine

## 2012-08-26 VITALS — BP 113/71 | HR 53 | Ht 64.0 in | Wt 125.0 lb

## 2012-08-26 DIAGNOSIS — I4949 Other premature depolarization: Secondary | ICD-10-CM

## 2012-08-26 DIAGNOSIS — R55 Syncope and collapse: Secondary | ICD-10-CM

## 2012-08-26 DIAGNOSIS — R002 Palpitations: Secondary | ICD-10-CM

## 2012-08-26 DIAGNOSIS — R001 Bradycardia, unspecified: Secondary | ICD-10-CM

## 2012-08-26 DIAGNOSIS — I498 Other specified cardiac arrhythmias: Secondary | ICD-10-CM

## 2012-08-26 DIAGNOSIS — I493 Ventricular premature depolarization: Secondary | ICD-10-CM

## 2012-08-26 NOTE — Assessment & Plan Note (Signed)
She's had no further palpitations. She has started weaning herself off of her flecainide. I've asked her to stop it altogether. We will review in 3 months howt she is doing.

## 2012-08-26 NOTE — Assessment & Plan Note (Signed)
No recurrent syncope 

## 2012-08-26 NOTE — Progress Notes (Signed)
.  kf Patient Care Team: Elias Else, MD as PCP - General (Family Medicine) Elias Else, MD (Family Medicine)   HPI  Mindy Green is a 52 y.o. female Seen in followup for palpitations secondary to PVCs and PACs; she was treated with flecainide.. She also has a history of syncope occurring as isolated episode that occurred while running in high heels.  She has done much better. There have been no further dizziness. Palpitations have been largely quiescent not withstanding the fact that she down titrated her flecainide from twice a day to daily. She thought that was slowing her down and causing her to gain weight.  Past Medical History  Diagnosis Date  . Asthma   . Dizziness   . PVC  and PACs   . Irregular bleeding   . Endometrial polyp   . Esophageal reflux   . Anxiety   . Rosacea   . External hemorrhoids     history of external hemorrhoids  . Spontaneous pneumothorax  April 1992     bilaterally with subsequent sclerosis    Past Surgical History  Procedure Date  . Appendectomy 1980  . Cesarean section 1984  . Cesarean section 1986  . Other surgical history      Paracervical block  . Hysteroscopy with resectoscope      used to remove polyps and fibroids  . Excision morton's neuroma  1998    of the left foot    Current Outpatient Prescriptions  Medication Sig Dispense Refill  . ALPRAZolam (XANAX) 0.5 MG tablet Take 0.5 mg by mouth at bedtime as needed.        . doxycycline (VIBRAMYCIN) 50 MG/5ML SYRP Take by mouth as needed.        . fexofenadine (ALLEGRA) 180 MG tablet Take 180 mg by mouth daily.        . flecainide (TAMBOCOR) 50 MG tablet TAKE ONE TABLET BY MOUTH TWICE DAILY  60 tablet  6  . fluticasone (FLONASE) 50 MCG/ACT nasal spray Place 2 sprays into the nose as needed.        Marland Kitchen ibuprofen (ADVIL,MOTRIN) 200 MG tablet Take 200 mg by mouth every 6 (six) hours as needed.        Marland Kitchen MAGNESIUM PO Take by mouth.       . Multiple Vitamin (MULTIVITAMIN) tablet Take 1  tablet by mouth daily.        . pirbuterol (MAXAIR AUTOHALER) 200 MCG/INH inhaler Inhale 2 puffs into the lungs 4 (four) times daily as needed.          No Known Allergies  Review of Systems negative except from HPI and PMH  Physical Exam BP 113/71  Pulse 53  Ht 5\' 4"  (1.626 m)  Wt 125 lb (56.7 kg)  BMI 21.46 kg/m2 Well developed and nourished in no acute distress HENT normal Neck supple with JVP-flat Carotids brisk and full without bruits Clear Regular rate and rhythm, no murmurs or gallops Abd-soft with active BS without hepatomegaly No Clubbing cyanosis edema Skin-warm and dry A & Oriented  Grossly normal sensory and motor function   ECG demonstrates sinus rhythm at 54 Intervals 15/09/45 Nonspecific ST changes in the inferolateral leads  Assessment and  Plan

## 2012-08-26 NOTE — Assessment & Plan Note (Signed)
Asymptomatic. 

## 2012-08-26 NOTE — Patient Instructions (Signed)
Your physician has recommended you make the following change in your medication:  1) Stop flecainide.  Your physician wants you to follow-up in: 6 months with Dr. Graciela Husbands. You will receive a reminder letter in the mail two months in advance. If you don't receive a letter, please call our office to schedule the follow-up appointment.

## 2013-04-18 ENCOUNTER — Encounter: Payer: Self-pay | Admitting: Internal Medicine

## 2013-07-06 ENCOUNTER — Other Ambulatory Visit: Payer: Self-pay

## 2013-07-06 DIAGNOSIS — Z1231 Encounter for screening mammogram for malignant neoplasm of breast: Secondary | ICD-10-CM

## 2013-08-12 ENCOUNTER — Ambulatory Visit
Admission: RE | Admit: 2013-08-12 | Discharge: 2013-08-12 | Disposition: A | Discharge status: Home / Self Care | Payer: 59 | Source: Ambulatory Visit

## 2013-08-12 DIAGNOSIS — Z1231 Encounter for screening mammogram for malignant neoplasm of breast: Secondary | ICD-10-CM

## 2013-12-07 ENCOUNTER — Encounter: Payer: Self-pay | Admitting: Cardiology

## 2014-02-15 ENCOUNTER — Encounter: Payer: Self-pay | Admitting: Family Medicine

## 2014-02-15 ENCOUNTER — Ambulatory Visit (INDEPENDENT_AMBULATORY_CARE_PROVIDER_SITE_OTHER): Payer: 59 | Admitting: Family Medicine

## 2014-02-15 VITALS — BP 112/70 | HR 68 | Temp 98.4°F | Resp 18 | Wt 131.0 lb

## 2014-02-15 DIAGNOSIS — J0141 Acute recurrent pansinusitis: Secondary | ICD-10-CM

## 2014-02-15 DIAGNOSIS — J018 Other acute sinusitis: Secondary | ICD-10-CM

## 2014-02-15 DIAGNOSIS — J309 Allergic rhinitis, unspecified: Secondary | ICD-10-CM | POA: Insufficient documentation

## 2014-02-15 MED ORDER — CEFUROXIME AXETIL 500 MG PO TABS: 500.0000 mg | ORAL_TABLET | Freq: Two times a day (BID) | ORAL | Status: DC

## 2014-02-15 MED ORDER — METHYLPREDNISOLONE ACETATE 80 MG/ML IJ SUSP
120.0000 mg | Freq: Once | INTRAMUSCULAR | Status: AC
  Administered 2014-02-15: 120 mg via INTRAMUSCULAR

## 2014-02-15 NOTE — Patient Instructions (Signed)

## 2014-02-15 NOTE — Progress Notes (Signed)
S:  This 53 y.o. Cauc female is [facility]. She presents w/ acute onset of hoarseness, nasal congestion, mild sinus pressure and eye redness w/ R eye mattered this morning. She also has NP cough. She denies fever/chills. 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 Chronic allergy medications include OTC Allegra and fluticasone nasal spray.   Patient Active Problem List   Diagnosis Date Noted  . Fatigue 05/23/2011  . Asthma 05/23/2011  . Syncope/fall 12/24/2010  . PVC (premature ventricular contraction) 12/24/2010  . Sinus bradycardia 12/24/2010   Outpatient Encounter Prescriptions as of 02/15/2014  Medication Sig  . ALPRAZolam (XANAX) 0.5 MG tablet Take 0.5 mg by mouth at bedtime as needed.    . fexofenadine (ALLEGRA) 180 MG tablet Take 180 mg by mouth daily.    . fluticasone (FLONASE) 50 MCG/ACT nasal spray Place 2 sprays into the nose as needed.    Marland Kitchen. ibuprofen (ADVIL,MOTRIN) 200 MG tablet Take 200 mg by mouth every 6 (six) hours as needed.    Marland Kitchen. MAGNESIUM PO Take by mouth.   . Multiple Vitamin (MULTIVITAMIN) tablet Take 1 tablet by mouth daily.    . pirbuterol (MAXAIR AUTOHALER) 200 MCG/INH inhaler Inhale 2 puffs into the lungs 4 (four) times daily as needed.      PMHx, Surg Hx, Soc and Fam Hx reviewed.   ROS: As per HPI.  O: Filed Vitals:   02/15/14 0926  BP: 112/70  Pulse: 68  Temp: 98.4 F (36.9 C)  Resp: 18   GEN: in NAD; WN,WD. HEENT: [place]/AT; EOMI w/ injected conj and clear sclerae. Lower lids puffy. Ext ears & canals normal. TMs normal w/ good light reflex w/o retraction or effusions. Nasal mucosa red but not edematous. Post ph mildly erythematous w/ cobblestoning. NECK: Supple w/o LAN or TMG. LUNGS: CTA; normal resp rate and effort. No wheezes or rhonchi. SKIN: W&D; intact w/o erythema or rashes. NEURO: A&O x 3; CNs intact. Nonfocal.  A/P: Acute recurrent pansinusitis - This is a recurrent problem given pt's exposure  and allergy history.Plan: RX: Cefuroxime 500 mg 1 tablet bid x 10 days, AYR saline nasal mist. Continue fluticasone NS and Allegra. Administer methylPREDNISolone acetate (DEPO-MEDROL) injection 120 mg x 1 dose.

## 2014-02-16 NOTE — Addendum Note (Signed)
Addended by: Dow AdolphMCPHERSON, Betsabe Iglesia B on: 02/16/2014 09:10 AM   Modules accepted: Level of Service

## 2014-07-06 ENCOUNTER — Other Ambulatory Visit (HOSPITAL_COMMUNITY)
Admission: RE | Admit: 2014-07-06 | Discharge: 2014-07-06 | Disposition: A | Discharge status: Home / Self Care | Payer: 59 | Source: Ambulatory Visit | Attending: Family Medicine | Admitting: Family Medicine

## 2014-07-06 ENCOUNTER — Other Ambulatory Visit: Payer: Self-pay | Admitting: Physician Assistant

## 2014-07-06 DIAGNOSIS — Z01419 Encounter for gynecological examination (general) (routine) without abnormal findings: Secondary | ICD-10-CM | POA: Insufficient documentation

## 2014-07-06 DIAGNOSIS — Z1151 Encounter for screening for human papillomavirus (HPV): Secondary | ICD-10-CM | POA: Diagnosis present

## 2014-07-07 LAB — CYTOLOGY - PAP

## 2014-07-17 ENCOUNTER — Other Ambulatory Visit: Payer: Self-pay

## 2014-07-17 DIAGNOSIS — Z1231 Encounter for screening mammogram for malignant neoplasm of breast: Secondary | ICD-10-CM

## 2014-08-14 ENCOUNTER — Other Ambulatory Visit: Payer: Self-pay

## 2014-08-14 ENCOUNTER — Ambulatory Visit
Admission: RE | Admit: 2014-08-14 | Discharge: 2014-08-14 | Disposition: A | Discharge status: Home / Self Care | Payer: PRIVATE HEALTH INSURANCE | Source: Ambulatory Visit

## 2014-08-14 DIAGNOSIS — Z1231 Encounter for screening mammogram for malignant neoplasm of breast: Secondary | ICD-10-CM

## 2015-02-19 ENCOUNTER — Other Ambulatory Visit: Payer: Self-pay | Admitting: Family Medicine

## 2015-02-19 ENCOUNTER — Ambulatory Visit
Admission: RE | Admit: 2015-02-19 | Discharge: 2015-02-19 | Disposition: A | Discharge status: Home / Self Care | Payer: PRIVATE HEALTH INSURANCE | Source: Ambulatory Visit | Attending: Family Medicine | Admitting: Family Medicine

## 2015-02-19 DIAGNOSIS — R0602 Shortness of breath: Secondary | ICD-10-CM

## 2015-07-10 ENCOUNTER — Other Ambulatory Visit: Payer: Self-pay

## 2015-07-10 DIAGNOSIS — Z1231 Encounter for screening mammogram for malignant neoplasm of breast: Secondary | ICD-10-CM

## 2015-08-17 ENCOUNTER — Ambulatory Visit
Admission: RE | Admit: 2015-08-17 | Discharge: 2015-08-17 | Disposition: A | Discharge status: Home / Self Care | Payer: 59 | Source: Ambulatory Visit

## 2015-08-17 DIAGNOSIS — Z1231 Encounter for screening mammogram for malignant neoplasm of breast: Secondary | ICD-10-CM

## 2016-08-11 ENCOUNTER — Other Ambulatory Visit: Payer: Self-pay | Admitting: Family Medicine

## 2016-08-11 DIAGNOSIS — Z1231 Encounter for screening mammogram for malignant neoplasm of breast: Secondary | ICD-10-CM

## 2016-08-26 ENCOUNTER — Ambulatory Visit
Admission: RE | Admit: 2016-08-26 | Discharge: 2016-08-26 | Disposition: A | Discharge status: Home / Self Care | Payer: 59 | Source: Ambulatory Visit | Attending: Family Medicine | Admitting: Family Medicine

## 2016-08-26 DIAGNOSIS — Z1231 Encounter for screening mammogram for malignant neoplasm of breast: Secondary | ICD-10-CM

## 2016-09-01 ENCOUNTER — Ambulatory Visit: Payer: 59

## 2017-01-13 ENCOUNTER — Ambulatory Visit
Admission: RE | Admit: 2017-01-13 | Discharge: 2017-01-13 | Disposition: A | Discharge status: Home / Self Care | Payer: 59 | Source: Ambulatory Visit | Attending: Family Medicine | Admitting: Family Medicine

## 2017-01-13 ENCOUNTER — Other Ambulatory Visit: Payer: Self-pay | Admitting: Family Medicine

## 2017-01-13 DIAGNOSIS — J069 Acute upper respiratory infection, unspecified: Secondary | ICD-10-CM

## 2017-07-27 ENCOUNTER — Other Ambulatory Visit: Payer: Self-pay | Admitting: Family Medicine

## 2017-07-27 DIAGNOSIS — Z1231 Encounter for screening mammogram for malignant neoplasm of breast: Secondary | ICD-10-CM

## 2017-07-29 ENCOUNTER — Other Ambulatory Visit (HOSPITAL_COMMUNITY)
Admission: RE | Admit: 2017-07-29 | Discharge: 2017-07-29 | Disposition: A | Discharge status: Home / Self Care | Payer: Managed Care, Other (non HMO) | Source: Ambulatory Visit | Attending: Family Medicine | Admitting: Family Medicine

## 2017-07-29 ENCOUNTER — Other Ambulatory Visit: Payer: Self-pay | Admitting: Family Medicine

## 2017-07-29 DIAGNOSIS — Z124 Encounter for screening for malignant neoplasm of cervix: Secondary | ICD-10-CM | POA: Insufficient documentation

## 2017-07-31 LAB — CYTOLOGY - PAP: Diagnosis: NEGATIVE

## 2017-08-27 ENCOUNTER — Ambulatory Visit: Payer: 59

## 2017-08-27 ENCOUNTER — Ambulatory Visit
Admission: RE | Admit: 2017-08-27 | Discharge: 2017-08-27 | Disposition: A | Discharge status: Home / Self Care | Payer: Managed Care, Other (non HMO) | Source: Ambulatory Visit | Attending: Family Medicine | Admitting: Family Medicine

## 2017-08-27 DIAGNOSIS — Z1231 Encounter for screening mammogram for malignant neoplasm of breast: Secondary | ICD-10-CM

## 2018-08-06 ENCOUNTER — Other Ambulatory Visit: Payer: Self-pay | Admitting: Family Medicine

## 2018-08-06 DIAGNOSIS — Z1231 Encounter for screening mammogram for malignant neoplasm of breast: Secondary | ICD-10-CM

## 2018-08-16 ENCOUNTER — Other Ambulatory Visit: Payer: Self-pay | Admitting: Family Medicine

## 2018-08-16 DIAGNOSIS — R109 Unspecified abdominal pain: Secondary | ICD-10-CM

## 2018-08-18 ENCOUNTER — Ambulatory Visit
Admission: RE | Admit: 2018-08-18 | Discharge: 2018-08-18 | Disposition: A | Discharge status: Home / Self Care | Payer: Managed Care, Other (non HMO) | Source: Ambulatory Visit | Attending: Family Medicine | Admitting: Family Medicine

## 2018-08-18 DIAGNOSIS — R109 Unspecified abdominal pain: Secondary | ICD-10-CM

## 2018-08-18 MED ORDER — IOPAMIDOL (ISOVUE-300) INJECTION 61%
100.0000 mL | Freq: Once | INTRAVENOUS | Status: AC | PRN
  Administered 2018-08-18: 100 mL via INTRAVENOUS

## 2018-08-30 ENCOUNTER — Ambulatory Visit
Admission: RE | Admit: 2018-08-30 | Discharge: 2018-08-30 | Disposition: A | Discharge status: Home / Self Care | Payer: Managed Care, Other (non HMO) | Source: Ambulatory Visit

## 2018-08-30 DIAGNOSIS — Z1231 Encounter for screening mammogram for malignant neoplasm of breast: Secondary | ICD-10-CM

## 2019-08-30 IMAGING — MG DIGITAL SCREENING BILATERAL MAMMOGRAM WITH TOMO AND CAD
8 series · 9 of 24 positions shown · non-contrast
Comparison: Previous exam(s).

CLINICAL DATA: Screening.

EXAM:
DIGITAL SCREENING BILATERAL MAMMOGRAM WITH TOMO AND CAD

[R MLO synth-2D]
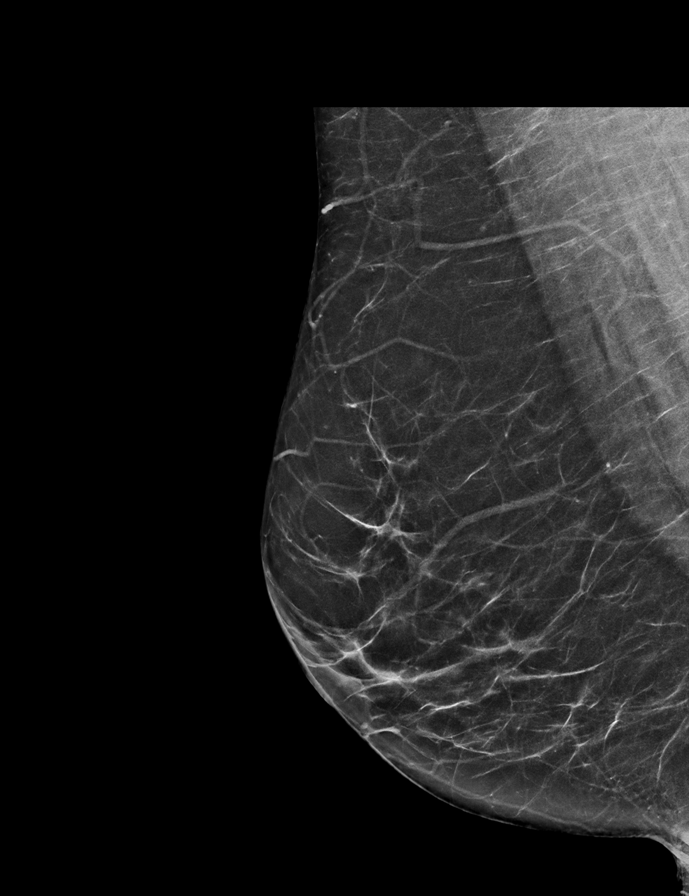

[L MLO synth-2D]
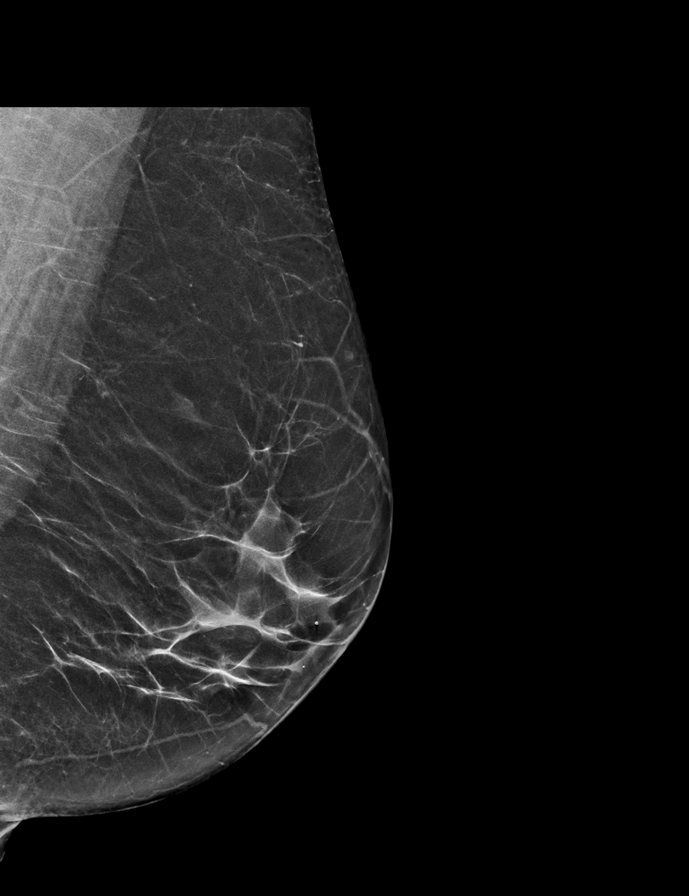

[R CC synth-2D]
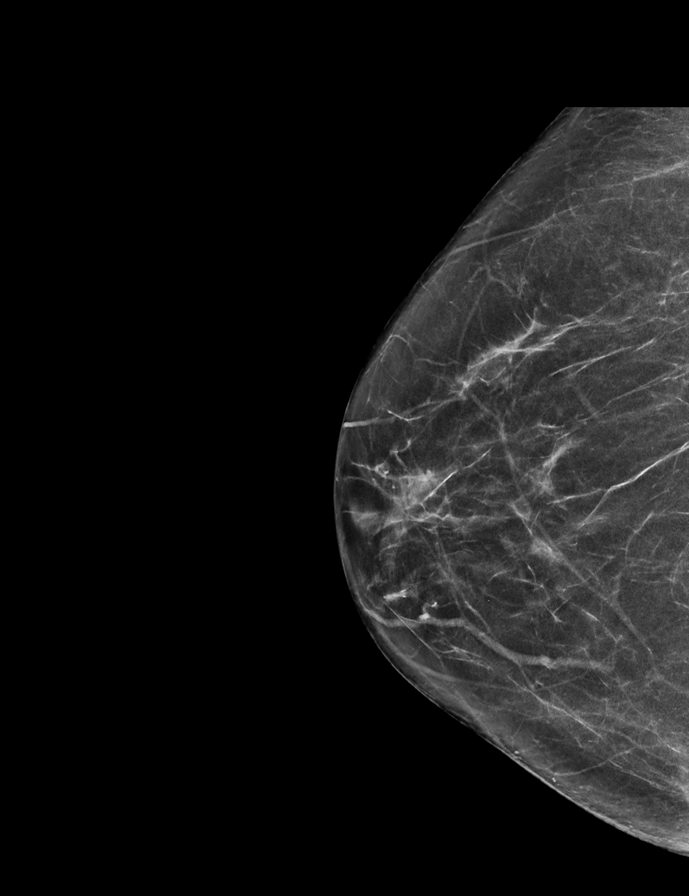

[L CC synth-2D]
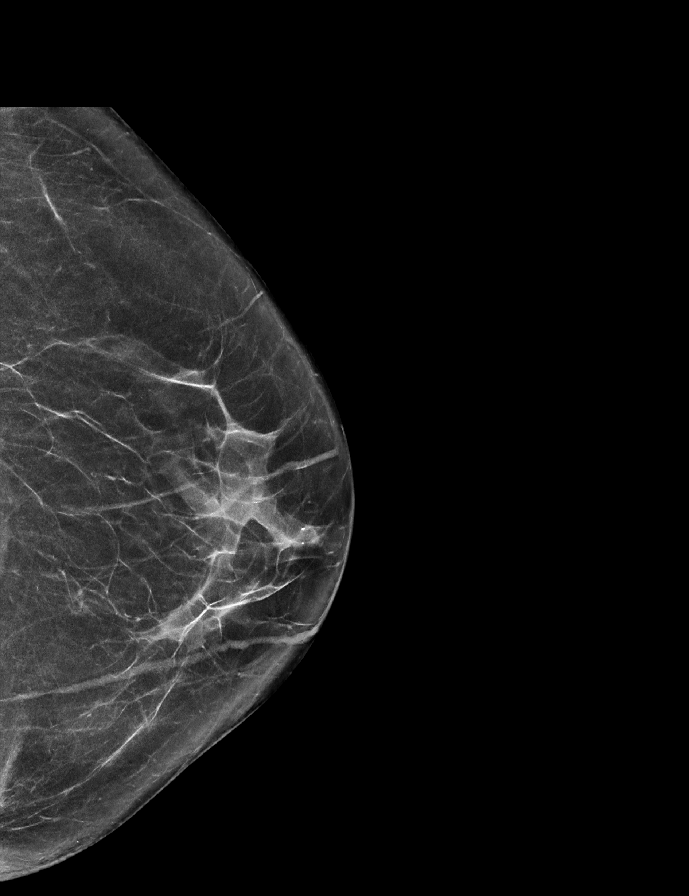

[L MLO tomo · 2 of 58 frames shown]
[frame 19/58]
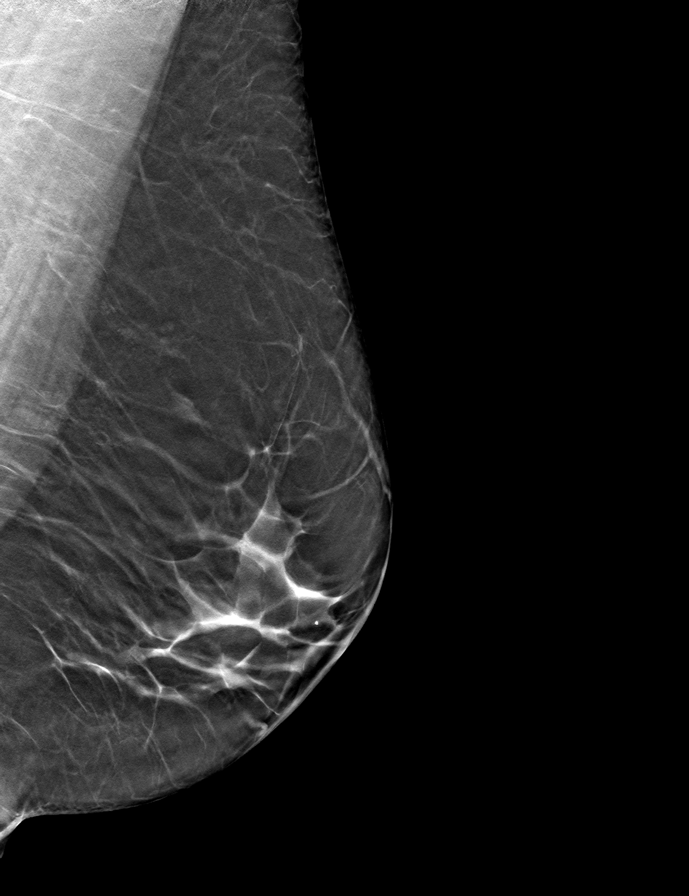
[frame 29/58]
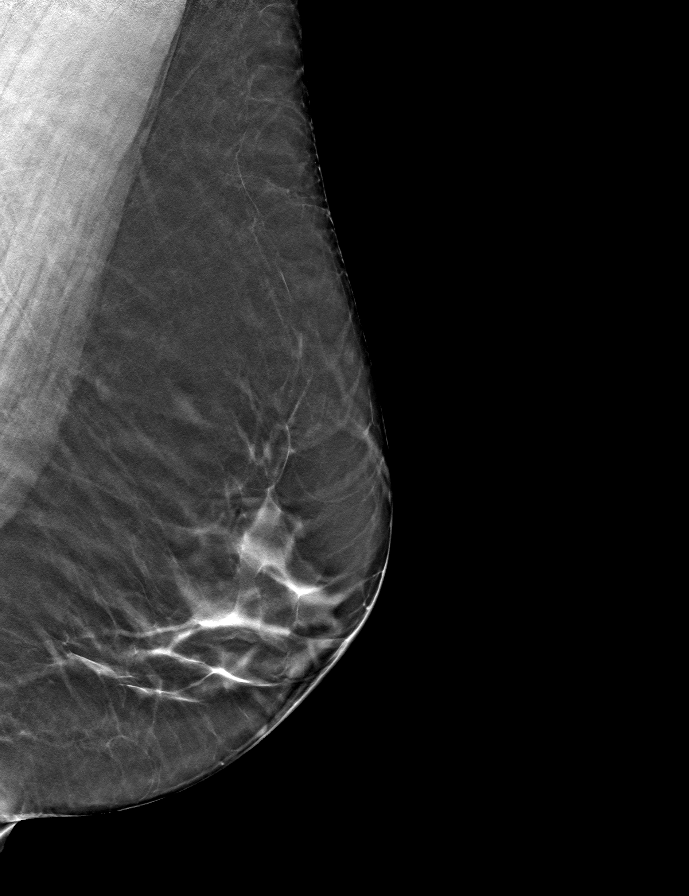

[L CC tomo · tomo slice 33/65.0]
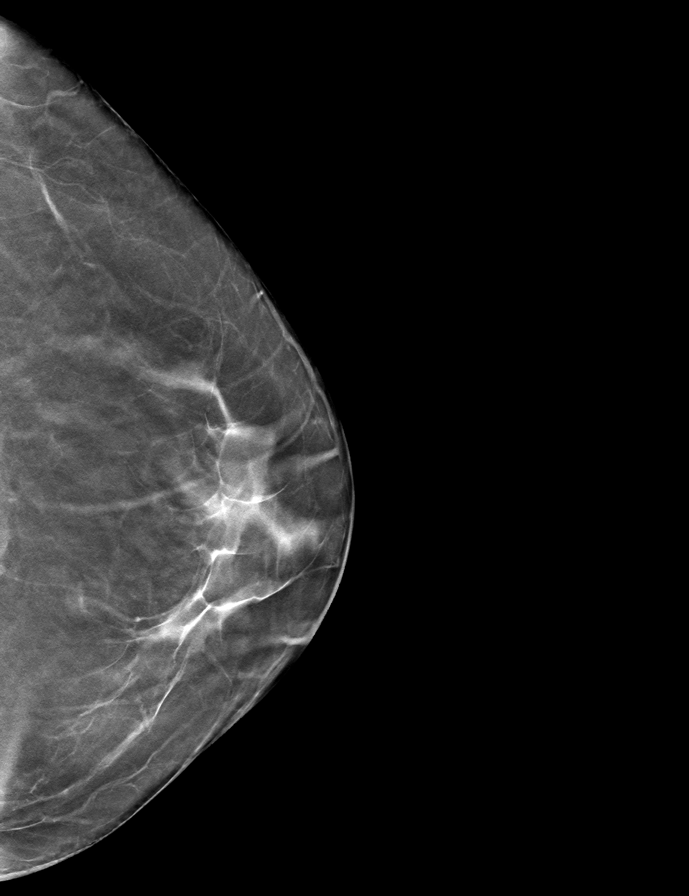

[R CC tomo · tomo slice 31/61.0]
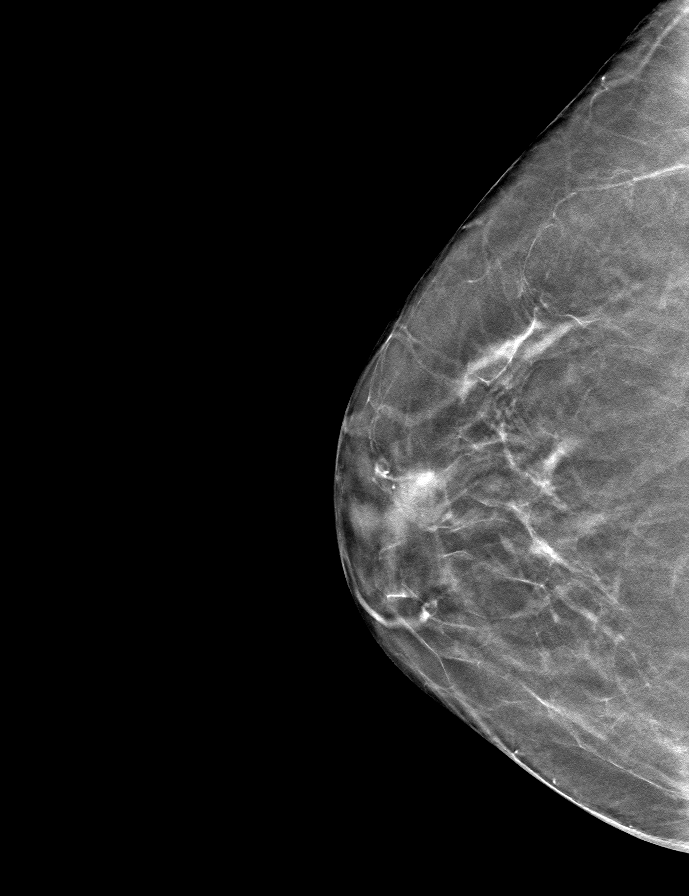

[R MLO tomo · tomo slice 33/65.0]
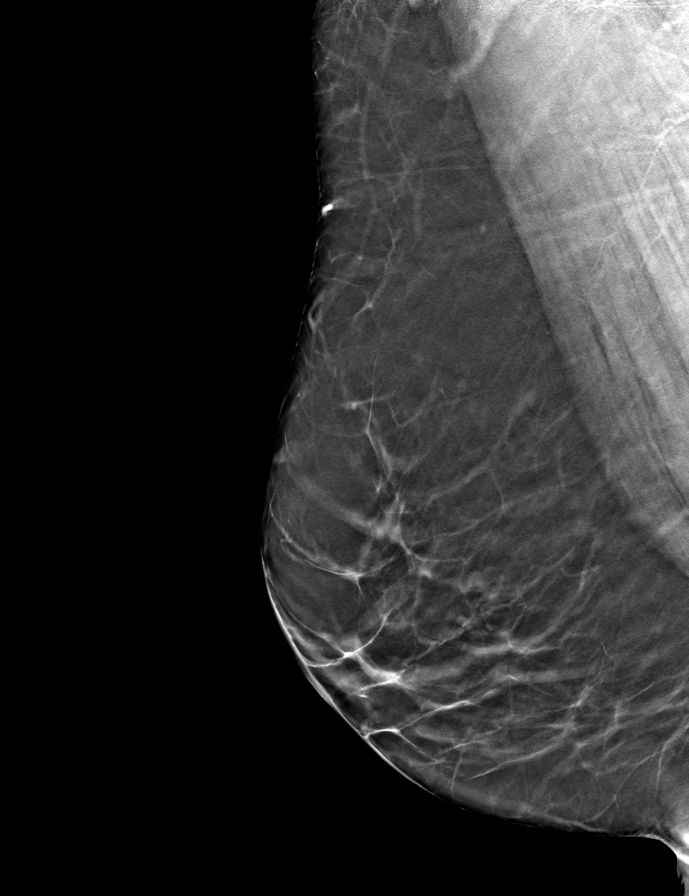

[9 of 24 positions shown; findings below may reference images not displayed]

ACR Breast Density Category b: There are scattered areas of
fibroglandular density.
FINDINGS: There are no findings suspicious for malignancy. Images were
processed with CAD.
IMPRESSION: No mammographic evidence of malignancy. A result letter of this
screening mammogram will be mailed directly to the patient.

RECOMMENDATION:
Screening mammogram in one year. (Code:[id number])

BI-RADS CATEGORY  1: Negative.

## 2019-11-10 ENCOUNTER — Other Ambulatory Visit: Payer: Self-pay | Admitting: Family Medicine

## 2019-11-29 ENCOUNTER — Ambulatory Visit: Payer: Managed Care, Other (non HMO) | Admitting: Critical Care Medicine

## 2019-11-29 ENCOUNTER — Encounter: Payer: Self-pay | Admitting: Critical Care Medicine

## 2019-11-29 ENCOUNTER — Other Ambulatory Visit: Payer: Self-pay

## 2019-11-29 VITALS — BP 138/70 | HR 72 | Temp 97.3°F | Ht 64.5 in | Wt 157.2 lb

## 2019-11-29 DIAGNOSIS — J4540 Moderate persistent asthma, uncomplicated: Secondary | ICD-10-CM | POA: Diagnosis not present

## 2019-11-29 DIAGNOSIS — Z23 Encounter for immunization: Secondary | ICD-10-CM

## 2019-11-29 MED ORDER — ALBUTEROL SULFATE HFA 108 (90 BASE) MCG/ACT IN AERS: 2.0000 | INHALATION_SPRAY | Freq: Four times a day (QID) | RESPIRATORY_TRACT | 11 refills | Status: AC | PRN

## 2019-11-29 MED ORDER — DULERA 100-5 MCG/ACT IN AERO: 2.0000 | INHALATION_SPRAY | Freq: Two times a day (BID) | RESPIRATORY_TRACT | 11 refills | Status: DC

## 2019-11-29 NOTE — Patient Instructions (Addendum)
Thank you for visiting Dr. Chestine Spore at [facility]. We recommend the following: Orders Placed This Encounter  Procedures  . Pulmonary function test   Orders Placed This Encounter  Procedures  . Pulmonary function test    Standing Status:   Future    Standing Expiration Date:   11/28/2020    Order Specific Question:   Where should this test be performed?    Answer:   [facility]    Order Specific Question:   Full PFT: includes the following: basic spirometry, spirometry pre & post bronchodilator, diffusion capacity (DLCO), lung volumes    Answer:   Full PFT    Meds ordered this encounter  Medications  . mometasone-formoterol (DULERA) 100-5 MCG/ACT AERO    Sig: Inhale 2 puffs into the lungs 2 (two) times daily.    Dispense:  13 g    Refill:  11  . albuterol (VENTOLIN HFA) 108 (90 Base) MCG/ACT inhaler    Sig: Inhale 2 puffs into the lungs every 6 (six) hours as needed for wheezing or shortness of breath.    Dispense:  18 g    Refill:  11    Return in about 2 months (around 01/29/2020).    Please do your part to reduce the spread of COVID-19.

## 2019-11-29 NOTE — Progress Notes (Signed)
Synopsis: Referred in May 2021 for asthma by Maury Dus, MD.  Subjective:   PATIENT ID: Mindy Green GENDER: female DOB: Feb 03, 1961, MRN: [id number]  Chief Complaint  Patient presents with  . Consult    no whezzing, does have Asthma attacks     Ms. Mindy Green is a 59 y/o woman with a history of bilateral spontaneous pneumothoraces and asthma who presents to establish care.  She had a right-sided spontaneous pneumothorax in 1992 that required multiple chest tubes prior to an open thoracotomy with pleurodesis.  Several months later she had a spontaneous left-sided pneumothorax requiring a partial thoracotomy.  Since then she has had several small pneumothoraces, less than 10% is resolved spontaneously on the left.  She was diagnosed with asthma around this time, but the pneumothoraces occurred due to ruptured blebs.  She has never been hospitalized for asthma other than associated with these pneumothoraces.  She smoked 1 pack/day for about 10 years prior to quitting in 1992.  She is currently on albuterol as needed for asthma, not on maintenance medications.  She takes Allegra daily throughout the year and Flonase during her allergy season for symptoms.  Smells and seasonal variation tends to trigger her symptoms.  She has frequent dyspnea on exertion that can limit her, but she is able to walk more than a mile.  She feels like she is progressively having worsening dyspnea on exertion over time.  She denies wheezing and sputum production, but has frequent coughing with asthma attacks.  Her mother had COPD, but was heavy smoker.  She is up-to-date on flu, Covid meds, and has had 2 pneumococcal 23 vaccines, but is overdue for next one.  She has never had pneumococcal 13 vaccine.       Past Medical History:  Diagnosis Date  . Anxiety   . Asthma   . Dizziness   . Endometrial polyp   . Esophageal reflux   . External hemorrhoids    history of external hemorrhoids  . Irregular bleeding   . PVC   and PACs   . Rosacea   . Spontaneous pneumothorax  April 1992    bilaterally with subsequent sclerosis     Family History  Problem Relation Age of Onset  . Hypertension Father 43       alive  . Asthma Mother 37       alive  . Other Sister 86       alive no medical problems  . Breast cancer Paternal Grandmother 1     Past Surgical History:  Procedure Laterality Date  . APPENDECTOMY  1980  . CESAREAN SECTION  1984  . CESAREAN SECTION  1986  . [place]   of the left foot  . HYSTEROSCOPY WITH RESECTOSCOPE      used to remove polyps and fibroids  . OTHER SURGICAL HISTORY      Paracervical block  . THORACOTOMY Bilateral 1992   spontaneous pneumothraces    Social History   Socioeconomic History  . Marital status: Married    Spouse name: Not on file  . Number of children: 2  . Years of education: Not on file  . Highest education level: Not on file  Occupational History  . Occupation: [employer] PA[employer]ABLE [employer]    Employer: [employer]  Tobacco Use  . Smoking status: Former Smoker    Packs/day: 1.00    Years: 10.00    Pack years: 10.00    Types: Cigarettes  Quit date: 07/21/1990    Years since quitting: 29.3  . Smokeless tobacco: Never Used  Substance and Sexual Activity  . Alcohol use: Yes    Comment: 4-5 beers a week  . Drug use: No  . Sexual activity: Not on file  Other Topics Concern  . Not on file  Social History Narrative  . Not on file   Social Determinants of Health   Financial Resource Strain:   . Difficulty of Paying Living Expenses:   Food Insecurity:   . Worried About Programme researcher, broadcasting/film/video in the Last Year:   . Barista in the Last Year:   Transportation Needs:   . Freight forwarder (Medical):   Marland Kitchen Lack of Transportation (Non-Medical):   Physical Activity:   . Days of Exercise per Week:   . Minutes of Exercise per Session:   Stress:   . Feeling of Stress :   Social Connections:   . Frequency of  Communication with Friends and Family:   . Frequency of Social Gatherings with Friends and Family:   . Attends Religious Services:   . Active Member of Clubs or Organizations:   . Attends Banker Meetings:   Marland Kitchen Marital Status:   Intimate Partner Violence:   . Fear of Current or Ex-Partner:   . Emotionally Abused:   Marland Kitchen Physically Abused:   . Sexually Abused:      Allergies  Allergen Reactions  . Albuterol Other (See Comments)  . Penicillin V Itching  . Azithromycin Rash  . Theophylline Rash     Immunization History  Administered Date(s) Administered  . Influenza-Unspecified 04/06/2019  . PFIZER SARS-COV-2 Vaccination 09/15/2019, 10/13/2019  . Pneumococcal Polysaccharide-23 11/29/2019    Outpatient Medications Prior to Visit  Medication Sig Dispense Refill  . acetaminophen (TYLENOL) 325 MG tablet Take by mouth every 6 (six) hours as needed.    . ALPRAZolam (XANAX) 0.5 MG tablet Take 0.5 mg by mouth at bedtime as needed.      . Ascorbic Acid (VITAMIN C) 100 MG tablet Take 500 mg by mouth daily.    . calcium carbonate (TUMS) 500 MG chewable tablet     . doxycycline (VIBRAMYCIN) 50 MG capsule Take 50 mg by mouth daily.    . fexofenadine (ALLEGRA) 180 MG tablet Take 180 mg by mouth daily.      . fluticasone (FLONASE) 50 MCG/ACT nasal spray Place 2 sprays into the nose as needed.      . hyoscyamine (LEVSIN SL) 0.125 MG SL tablet Place 0.125 mg under the tongue every 4 (four) hours as needed.    Marland Kitchen ibuprofen (ADVIL,MOTRIN) 200 MG tablet Take 200 mg by mouth every 6 (six) hours as needed.      . Multiple Vitamin (MULTIVITAMIN) tablet Take 1 tablet by mouth daily.      . naproxen sodium (ALEVE) 220 MG tablet Take 220 mg by mouth 2 (two) times daily as needed.    . pantoprazole (PROTONIX) 40 MG tablet Take 40 mg by mouth daily.    . Pyridoxine HCl (VITAMIN B-6) 500 MG tablet Take 500 mg by mouth daily.    . Turmeric 500 MG CAPS Take by mouth.    Marland Kitchen albuterol (VENTOLIN HFA)  108 (90 Base) MCG/ACT inhaler Inhale 2 puffs into the lungs every 6 (six) hours as needed for wheezing or shortness of breath.    . cefUROXime (CEFTIN) 500 MG tablet Take 1 tablet (500 mg total) by mouth 2 (two)  times daily with a meal. 20 tablet 0  . MAGNESIUM PO Take by mouth.     . pirbuterol (MAXAIR AUTOHALER) 200 MCG/INH inhaler Inhale 2 puffs into the lungs 4 (four) times daily as needed.       No facility-administered medications prior to visit.    Review of Systems  Constitutional: Negative for chills and fever.  Respiratory: Positive for cough, shortness of breath and wheezing.   Cardiovascular: Negative for chest pain and leg swelling.  Gastrointestinal: Negative.   Musculoskeletal: Negative for joint pain and myalgias.  Skin: Negative for rash.  Neurological: Negative for focal weakness.  Endo/Heme/Allergies: Positive for environmental allergies.     Objective:   Vitals:   11/29/19 1622  BP: 138/70  Pulse: 72  Temp: (!) 97.3 F (36.3 C)  TempSrc: Temporal  SpO2: 95%  Weight: 157 lb 3.2 oz (71.3 kg)  Height: 5' 4.5" (1.638 m)   95% on   RA BMI Readings from Last 3 Encounters:  11/29/19 26.57 kg/m  02/15/14 22.49 kg/m  08/26/12 21.46 kg/m   Wt Readings from Last 3 Encounters:  11/29/19 157 lb 3.2 oz (71.3 kg)  02/15/14 131 lb (59.4 kg)  08/26/12 125 lb (56.7 kg)    Physical Exam Vitals reviewed.  Constitutional:      Appearance: She is not ill-appearing.  HENT:     Head: Normocephalic and atraumatic.  Eyes:     General: No scleral icterus. Cardiovascular:     Rate and Rhythm: Normal rate and regular rhythm.     Heart sounds: No murmur.  Pulmonary:     Comments: CTAB, breathing comfortably on RA Abdominal:     General: There is no distension.     Palpations: Abdomen is soft.     Tenderness: There is no abdominal tenderness.  Musculoskeletal:        General: No swelling or deformity.     Cervical back: Neck supple.  Lymphadenopathy:      Cervical: No cervical adenopathy.  Skin:    General: Skin is warm and dry.     Findings: No rash.  Neurological:     General: No focal deficit present.     Mental Status: She is alert.     Motor: No weakness.     Coordination: Coordination normal.  Psychiatric:        Mood and Affect: Mood normal.      CBC    Component Value Date/Time   WBC 6.2 05/23/2011 1201   RBC 4.70 05/23/2011 1201   HGB 13.8 05/23/2011 1201   HCT 41.6 05/23/2011 1201   PLT 192.0 05/23/2011 1201   MCV 88.6 05/23/2011 1201   MCH 29.1 12/19/2010 1100   MCHC 33.1 05/23/2011 1201   RDW 12.0 05/23/2011 1201   LYMPHSABS 2.1 05/23/2011 1201   MONOABS 0.5 05/23/2011 1201   EOSABS 0.0 05/23/2011 1201   BASOSABS 0.0 05/23/2011 1201    CHEMISTRY No results for input(s): NA, K, CL, CO2, GLUCOSE, BUN, CREATININE, CALCIUM, MG, PHOS in the last 168 hours. CrCl cannot be calculated (Patient's most recent lab result is older than the maximum 21 days allowed.).   Chest Imaging- films reviewed: CXR 2 view- multiple surgical stables on the right.   Pulmonary Functions Testing Results: No flowsheet data found.      Assessment & Plan:     ICD-10-CM   1. Moderate persistent asthma without complication  J45.40 Pulmonary function test   Moderate persistent asthma  -Start Advair 100-50 twice  daily.  Rinse her mouth after every use. -Albuterol every 4 hours as needed for breakthrough symptoms. -Pneumococcal 23 vaccine today.  Will need repeat vaccination at the age of 36.  13 vaccination in 1 year. -Up-to-date on flu and Covid vaccines. -PFTs  -Review of previous charts completed; 600 page document in a PDF provided by the patient and reviewed after the visit.  Bullous lung disease; s/p bilateral pleurodesis -Continue to avoid tobacco -Based on results of PFTs will consider if additional imaging and alpha-1 antitrypsin testing is indicated.  It was discussed but not resulted in the paperwork from her  hospitalizations in the early 90s.   RTC in 2 months.  Greater than 60 minutes of time spent on this encounter reviewing records, documenting, and time spent face-to-face with the patient.   Current Outpatient Medications:  .  acetaminophen (TYLENOL) 325 MG tablet, Take by mouth every 6 (six) hours as needed., Disp: , Rfl:  .  albuterol (VENTOLIN HFA) 108 (90 Base) MCG/ACT inhaler, Inhale 2 puffs into the lungs every 6 (six) hours as needed for wheezing or shortness of breath., Disp: 18 g, Rfl: 11 .  ALPRAZolam (XANAX) 0.5 MG tablet, Take 0.5 mg by mouth at bedtime as needed.  , Disp: , Rfl:  .  Ascorbic Acid (VITAMIN C) 100 MG tablet, Take 500 mg by mouth daily., Disp: , Rfl:  .  calcium carbonate (TUMS) 500 MG chewable tablet, , Disp: , Rfl:  .  doxycycline (VIBRAMYCIN) 50 MG capsule, Take 50 mg by mouth daily., Disp: , Rfl:  .  fexofenadine (ALLEGRA) 180 MG tablet, Take 180 mg by mouth daily.  , Disp: , Rfl:  .  fluticasone (FLONASE) 50 MCG/ACT nasal spray, Place 2 sprays into the nose as needed.  , Disp: , Rfl:  .  hyoscyamine (LEVSIN SL) 0.125 MG SL tablet, Place 0.125 mg under the tongue every 4 (four) hours as needed., Disp: , Rfl:  .  ibuprofen (ADVIL,MOTRIN) 200 MG tablet, Take 200 mg by mouth every 6 (six) hours as needed.  , Disp: , Rfl:  .  Multiple Vitamin (MULTIVITAMIN) tablet, Take 1 tablet by mouth daily.  , Disp: , Rfl:  .  naproxen sodium (ALEVE) 220 MG tablet, Take 220 mg by mouth 2 (two) times daily as needed., Disp: , Rfl:  .  pantoprazole (PROTONIX) 40 MG tablet, Take 40 mg by mouth daily., Disp: , Rfl:  .  Pyridoxine HCl (VITAMIN B-6) 500 MG tablet, Take 500 mg by mouth daily., Disp: , Rfl:  .  Turmeric 500 MG CAPS, Take by mouth., Disp: , Rfl:  .  cefUROXime (CEFTIN) 500 MG tablet, Take 1 tablet (500 mg total) by mouth 2 (two) times daily with a meal., Disp: 20 tablet, Rfl: 0 .  MAGNESIUM PO, Take by mouth. , Disp: , Rfl:  .  mometasone-formoterol (DULERA) 100-5  MCG/ACT AERO, Inhale 2 puffs into the lungs 2 (two) times daily., Disp: 13 g, Rfl: 11 .  pirbuterol (MAXAIR AUTOHALER) 200 MCG/INH inhaler, Inhale 2 puffs into the lungs 4 (four) times daily as needed.  , Disp: , Rfl:     Steffanie Dunn, DO [facility] 11/29/2019 7:03 PM

## 2019-12-05 NOTE — Telephone Encounter (Signed)
Believe it is reasonable for the patient to stop the inhaler at this time.  If symptoms continue to clinically worsen she needs to be scheduled for a virtual visit or an office visit.  Patient still needs to complete pulmonary function testing.  May also be helpful for the patient to let us know what medication options are on her formulary.  Elisha Headland, FNP

## 2019-12-05 NOTE — Telephone Encounter (Signed)
Good Morton Amy, I Spoke with patient regarding Elwin Sleight was started on 5/11 by [facility] she has been having a cough with chest tightness no fever . Can you please advise.Thank you         Ever since I started the new medication you prescribed, I have had a cough. Yesterday, Sunday, I was feeling heavy chested and tired. This morning I woke up really heavy chested and have used  my rescue inhaler as well. I do not know if it's related to the change of weather, seasonal allergies, or the medication. No wheezing, just heavy chested and shortness of breath. Just wanted to make you aware. Thanks. Jimena

## 2020-01-09 ENCOUNTER — Telehealth: Payer: Self-pay | Admitting: Critical Care Medicine

## 2020-01-09 NOTE — Telephone Encounter (Signed)
Spoke with pt. She did not get her drug formualry from her insurance company. Pt is going to get this information and upload it through Mychart.

## 2020-02-03 ENCOUNTER — Other Ambulatory Visit: Payer: Self-pay

## 2020-02-03 ENCOUNTER — Ambulatory Visit (INDEPENDENT_AMBULATORY_CARE_PROVIDER_SITE_OTHER): Payer: Managed Care, Other (non HMO) | Admitting: Critical Care Medicine

## 2020-02-03 DIAGNOSIS — J4540 Moderate persistent asthma, uncomplicated: Secondary | ICD-10-CM

## 2020-02-03 NOTE — Progress Notes (Signed)
Full PFT performed today. °

## 2020-02-09 NOTE — Progress Notes (Signed)
Her PFTs show that her asthma is not fully controlled. Please ensure she is taking her asthma medication as prescribed. If she has been compliant, please increase to dulera 200. Thanks!  LPC

## 2020-02-21 NOTE — Telephone Encounter (Signed)
Dr. Chestine Spore, please see pt's mychart message and advise on it.  I am also sending this to pharmacy team so we can see what inhalers are preferred with pt's insurance.

## 2020-02-21 NOTE — Telephone Encounter (Signed)
Ran test claims for 1 month supply:  Advair Diskus- copay $10.00- generic formulations not covered by plan- PA req  Symbicort- copay$ 45.00- generic formulation not covered- PA req  Breo- PA required  Airduo- not covered- PA required

## 2020-02-22 ENCOUNTER — Other Ambulatory Visit: Payer: Self-pay | Admitting: Critical Care Medicine

## 2020-02-22 MED ORDER — FLUTICASONE-SALMETEROL 250-50 MCG/DOSE IN AEPB: 1.0000 | INHALATION_SPRAY | Freq: Two times a day (BID) | RESPIRATORY_TRACT | 5 refills | Status: DC

## 2020-02-22 NOTE — Progress Notes (Signed)
Seems reasonable. I agree we should try switching back to Advair. Please prescribe Advair 250-50, 1 puff two times daily. Thanks!  LPC

## 2020-04-18 LAB — PULMONARY FUNCTION TEST
DL/VA % pred: 92 %
DL/VA: 3.84 ml/min/mmHg/L
DLCO cor % pred: 95 %
DLCO cor: 19.97 ml/min/mmHg
DLCO unc % pred: 95 %
DLCO unc: 19.97 ml/min/mmHg
FEF 25-75 Post: 2.2 L/sec
FEF 25-75 Pre: 1.54 L/sec
FEF2575-%Change-Post: 43 %
FEF2575-%Pred-Post: 93 %
FEF2575-%Pred-Pre: 65 %
FEV1-%Change-Post: 9 %
FEV1-%Pred-Post: 101 %
FEV1-%Pred-Pre: 92 %
FEV1-Post: 2.66 L
FEV1-Pre: 2.43 L
FEV1FVC-%Change-Post: 6 %
FEV1FVC-%Pred-Pre: 88 %
FEV6-%Change-Post: 4 %
FEV6-%Pred-Post: 109 %
FEV6-%Pred-Pre: 105 %
FEV6-Post: 3.61 L
FEV6-Pre: 3.46 L
FEV6FVC-%Change-Post: 0 %
FEV6FVC-%Pred-Post: 103 %
FEV6FVC-%Pred-Pre: 103 %
FVC-%Change-Post: 2 %
FVC-%Pred-Post: 106 %
FVC-%Pred-Pre: 103 %
FVC-Post: 3.61 L
Post FEV1/FVC ratio: 74 %
Post FEV6/FVC ratio: 100 %
Pre FEV1/FVC ratio: 69 %
Pre FEV6/FVC Ratio: 99 %
RV % pred: 87 %
RV: 1.79 L
TLC % pred: 103 %
TLC: 5.38 L

## 2020-08-08 ENCOUNTER — Other Ambulatory Visit (HOSPITAL_COMMUNITY)
Admission: RE | Admit: 2020-08-08 | Discharge: 2020-08-08 | Disposition: A | Discharge status: Home / Self Care | Payer: Managed Care, Other (non HMO) | Source: Ambulatory Visit | Attending: Family Medicine | Admitting: Family Medicine

## 2020-08-08 ENCOUNTER — Other Ambulatory Visit: Payer: Self-pay | Admitting: Family Medicine

## 2020-08-08 DIAGNOSIS — Z124 Encounter for screening for malignant neoplasm of cervix: Secondary | ICD-10-CM | POA: Diagnosis present

## 2020-08-15 LAB — CYTOLOGY - PAP
Comment: NEGATIVE
Diagnosis: NEGATIVE
High risk HPV: NEGATIVE

## 2024-07-06 ENCOUNTER — Ambulatory Visit
Admission: EM | Admit: 2024-07-06 | Discharge: 2024-07-06 | Disposition: A | Discharge status: Home / Self Care | Attending: Family Medicine | Admitting: Family Medicine

## 2024-07-06 ENCOUNTER — Encounter: Payer: Self-pay | Admitting: Emergency Medicine

## 2024-07-06 DIAGNOSIS — J4521 Mild intermittent asthma with (acute) exacerbation: Secondary | ICD-10-CM

## 2024-07-06 DIAGNOSIS — U071 COVID-19: Secondary | ICD-10-CM

## 2024-07-06 LAB — POC COVID19/FLU A&B COMBO
Covid Antigen, POC: POSITIVE — AB
Influenza A Antigen, POC: NEGATIVE
Influenza B Antigen, POC: NEGATIVE

## 2024-07-06 LAB — POCT RAPID STREP A (OFFICE): Rapid Strep A Screen: NEGATIVE

## 2024-07-06 MED ORDER — AZELASTINE HCL 0.1 % NA SOLN: 1.0000 | Freq: Two times a day (BID) | NASAL | 0 refills | Status: AC

## 2024-07-06 MED ORDER — PROMETHAZINE-DM 6.25-15 MG/5ML PO SYRP: 5.0000 mL | ORAL_SOLUTION | Freq: Four times a day (QID) | ORAL | 0 refills | Status: AC | PRN

## 2024-07-06 NOTE — ED Triage Notes (Signed)
 Sore throat, headache since Monday.  Ears feel full, body aches, and sinus congestion.  Has been taking nyquil.

## 2024-07-08 NOTE — ED Provider Notes (Signed)
 " [facility]    CSN: [id number] Arrival date & time: 07/06/24  1040      History   Chief Complaint No chief complaint on file.   HPI Mindy Green is a 63 y.o. female.   Patient presenting today with 3-day history of sore throat, headache, ear pressure, body aches, nasal congestion, fatigue.  Denies chest pain, shortness of breath, abdominal pain, vomiting, diarrhea.  So far trying NyQuil with minimal relief.  History of asthma on albuterol  as needed.    Past Medical History:  Diagnosis Date   Anxiety    Asthma    Dizziness    Endometrial polyp    Esophageal reflux    External hemorrhoids    history of external hemorrhoids   Irregular bleeding    PVC  and PACs    Rosacea    Spontaneous pneumothorax  April 1992    bilaterally with subsequent sclerosis    Patient Active Problem List   Diagnosis Date Noted   Allergic rhinitis 02/15/2014   Fatigue 05/23/2011   Asthma 05/23/2011   Syncope/fall 12/24/2010   PVC (premature ventricular contraction) 12/24/2010   Sinus bradycardia 12/24/2010    Past Surgical History:  Procedure Laterality Date   APPENDECTOMY  1980   CESAREAN SECTION  1984   CESAREAN SECTION  1986   EXCISION MORTON'S NEUROMA   1998   of the left foot   HYSTEROSCOPY WITH RESECTOSCOPE      used to remove polyps and fibroids   OTHER SURGICAL HISTORY      Paracervical block   THORACOTOMY Bilateral 1992   spontaneous pneumothraces    OB History   No obstetric history on file.      Home Medications    Prior to Admission medications  Medication Sig Start Date End Date Taking? Authorizing Provider  azelastine  (ASTELIN ) 0.1 % nasal spray Place 1 spray into both nostrils 2 (two) times daily. Use in each nostril as directed 07/06/24  Yes Stuart Vernell Norris, PA-C  promethazine -dextromethorphan (PROMETHAZINE -DM) 6.25-15 MG/5ML syrup Take 5 mLs by mouth 4 (four) times daily as needed. 07/06/24  Yes Stuart Vernell Norris, PA-C   acetaminophen (TYLENOL) 325 MG tablet Take by mouth every 6 (six) hours as needed.    [provider]  albuterol  (VENTOLIN  HFA) 108 (90 Base) MCG/ACT inhaler Inhale 2 puffs into the lungs every 6 (six) hours as needed for wheezing or shortness of breath. 11/29/19   Gretta Leita SQUIBB, DO  ALPRAZolam (XANAX) 0.5 MG tablet Take 0.5 mg by mouth at bedtime as needed.      [provider]  Ascorbic Acid (VITAMIN C) 100 MG tablet Take 500 mg by mouth daily.    [provider]  calcium carbonate (TUMS) 500 MG chewable tablet     [provider]  fexofenadine (ALLEGRA) 180 MG tablet Take 180 mg by mouth daily.      [provider]  fluticasone  (FLONASE) 50 MCG/ACT nasal spray Place 2 sprays into the nose as needed.      [provider]  hyoscyamine (LEVSIN SL) 0.125 MG SL tablet Place 0.125 mg under the tongue every 4 (four) hours as needed.    [provider]  ibuprofen (ADVIL,MOTRIN) 200 MG tablet Take 200 mg by mouth every 6 (six) hours as needed.      [provider]  Multiple Vitamin (MULTIVITAMIN) tablet Take 1 tablet by mouth daily.      [provider]  naproxen sodium (ALEVE)  220 MG tablet Take 220 mg by mouth 2 (two) times daily as needed.    [provider]  pantoprazole (PROTONIX) 40 MG tablet Take 40 mg by mouth daily.    [provider]  Pyridoxine HCl (VITAMIN B-6) 500 MG tablet Take 500 mg by mouth daily.    [provider]  Turmeric 500 MG CAPS Take by mouth.    [provider]    Family History Family History  Problem Relation Age of Onset   Hypertension Father 68       alive   Asthma Mother 68       alive   Other Sister 41       alive no medical problems   Breast cancer Paternal Grandmother 60    Social History Social History[1]   Allergies   Albuterol , Penicillin v, Azithromycin, and Theophylline   Review of Systems Review of Systems Per HPI  Physical  Exam Triage Vital Signs ED Triage Vitals  Encounter Vitals Group     BP 07/06/24 1159 132/84     Girls Systolic BP Percentile --      Girls Diastolic BP Percentile --      Boys Systolic BP Percentile --      Boys Diastolic BP Percentile --      Pulse Rate 07/06/24 1159 99     Resp 07/06/24 1159 20     Temp 07/06/24 1159 98.9 F (37.2 C)     Temp Source 07/06/24 1159 Oral     SpO2 07/06/24 1159 96 %     Weight --      Height --      Head Circumference --      Peak Flow --      Pain Score 07/06/24 1200 8     Pain Loc --      Pain Education --      Exclude from Growth Chart --    No data found.  Updated Vital Signs BP 132/84 (BP Location: Right Arm)   Pulse 99   Temp 98.9 F (37.2 C) (Oral)   Resp 20   LMP 08/08/2010   SpO2 96%   Visual Acuity Right Eye Distance:   Left Eye Distance:   Bilateral Distance:    Right Eye Near:   Left Eye Near:    Bilateral Near:     Physical Exam Vitals and nursing note reviewed.  Constitutional:      Appearance: Normal appearance.  HENT:     Head: Atraumatic.     Right Ear: Tympanic membrane and external ear normal.     Left Ear: Tympanic membrane and external ear normal.     Nose: Rhinorrhea present.     Mouth/Throat:     Mouth: Mucous membranes are moist.     Pharynx: Posterior oropharyngeal erythema present.  Eyes:     Extraocular Movements: Extraocular movements intact.     Conjunctiva/sclera: Conjunctivae normal.  Cardiovascular:     Rate and Rhythm: Normal rate and regular rhythm.     Heart sounds: Normal heart sounds.  Pulmonary:     Effort: Pulmonary effort is normal.     Breath sounds: No wheezing.  Musculoskeletal:        General: Normal range of motion.     Cervical back: Normal range of motion and neck supple.  Skin:    General: Skin is warm and dry.  Neurological:     Mental Status: She is alert and oriented to person, place,  and time.  Psychiatric:        Mood and Affect: Mood normal.        Thought  Content: Thought content normal.      UC Treatments / Results  Labs (all labs ordered are listed, but only abnormal results are displayed) Labs Reviewed  POC COVID19/FLU A&B COMBO - Abnormal; Notable for the following components:      Result Value   Covid Antigen, POC Positive (*)    All other components within normal limits  POCT RAPID STREP A (OFFICE)    EKG   Radiology No results found.  Procedures Procedures (including critical care time)  Medications Ordered in UC Medications - No data to display  Initial Impression / Assessment and Plan / UC Course  I have reviewed the triage vital signs and the nursing notes.  Pertinent labs & imaging results that were available during my care of the patient were reviewed by me and considered in my medical decision making (see chart for details).     Vital signs within normal limits, she is well-appearing and in no acute distress.  Rapid COVID-positive, will treat with Phenergan  DM, Astelin , supportive over-the-counter medications and home care.  Continue albuterol  as needed for asthma exacerbation secondary to viral respiratory infection.  Supportive home care and return precautions reviewed.  Final Clinical Impressions(s) / UC Diagnoses   Final diagnoses:  COVID-19  Mild intermittent asthma with acute exacerbation   Discharge Instructions   None    ED Prescriptions     Medication Sig Dispense Auth. Provider   promethazine -dextromethorphan (PROMETHAZINE -DM) 6.25-15 MG/5ML syrup Take 5 mLs by mouth 4 (four) times daily as needed. 100 mL Stuart Vernell Norris, PA-C   azelastine  (ASTELIN ) 0.1 % nasal spray Place 1 spray into both nostrils 2 (two) times daily. Use in each nostril as directed 30 mL Stuart Vernell Norris, PA-C      PDMP not reviewed this encounter.    [1]  Social History Tobacco Use   Smoking status: Former    Current packs/day: 0.00    Average packs/day: 1 pack/day for 10.0 years (10.0 ttl pk-yrs)     Types: Cigarettes    Start date: 07/21/1980    Quit date: 07/21/1990    Years since quitting: 33.9   Smokeless tobacco: Never  Substance Use Topics   Alcohol use: Yes    Comment: 4-5 beers a week   Drug use: No     Stuart Vernell Norris, PA-C 07/08/24 1705  "
